# Patient Record
Sex: Male | Born: 1990 | Race: Black or African American | Hispanic: No | Marital: Single | Smoking: Current every day smoker
Health system: Southern US, Community
[De-identification: ages and names within clinical notes are randomized; demographics above are authoritative.]

---
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
## 2009-01-26 ENCOUNTER — Emergency Department (HOSPITAL_COMMUNITY): Admission: EM | Admit: 2009-01-26 | Discharge: 2009-01-26 | Payer: Self-pay | Admitting: Emergency Medicine

## 2009-09-25 ENCOUNTER — Emergency Department (HOSPITAL_COMMUNITY): Admission: EM | Admit: 2009-09-25 | Discharge: 2009-09-25 | Payer: Self-pay | Admitting: Emergency Medicine

## 2009-09-28 ENCOUNTER — Emergency Department (HOSPITAL_COMMUNITY): Admission: EM | Admit: 2009-09-28 | Discharge: 2009-09-28 | Payer: Self-pay | Admitting: Emergency Medicine

## 2010-05-07 LAB — CULTURE, ROUTINE-ABSCESS: Culture: NO GROWTH

## 2010-08-08 ENCOUNTER — Emergency Department (HOSPITAL_COMMUNITY)
Admission: EM | Admit: 2010-08-08 | Discharge: 2010-08-08 | Disposition: A | Discharge status: Home / Self Care | Payer: Self-pay | Attending: Emergency Medicine | Admitting: Emergency Medicine

## 2010-08-08 DIAGNOSIS — Y92410 Unspecified street and highway as the place of occurrence of the external cause: Secondary | ICD-10-CM | POA: Insufficient documentation

## 2010-08-08 DIAGNOSIS — F1910 Other psychoactive substance abuse, uncomplicated: Secondary | ICD-10-CM | POA: Insufficient documentation

## 2010-08-08 DIAGNOSIS — IMO0002 Reserved for concepts with insufficient information to code with codable children: Secondary | ICD-10-CM | POA: Insufficient documentation

## 2010-08-08 DIAGNOSIS — S0180XA Unspecified open wound of other part of head, initial encounter: Secondary | ICD-10-CM | POA: Insufficient documentation

## 2010-08-15 ENCOUNTER — Emergency Department (HOSPITAL_COMMUNITY)
Admission: EM | Admit: 2010-08-15 | Discharge: 2010-08-15 | Disposition: A | Discharge status: Home / Self Care | Payer: Self-pay | Attending: Emergency Medicine | Admitting: Emergency Medicine

## 2010-08-15 DIAGNOSIS — F1210 Cannabis abuse, uncomplicated: Secondary | ICD-10-CM | POA: Insufficient documentation

## 2010-08-15 DIAGNOSIS — Z4802 Encounter for removal of sutures: Secondary | ICD-10-CM | POA: Insufficient documentation

## 2010-09-08 ENCOUNTER — Emergency Department (HOSPITAL_COMMUNITY)
Admission: EM | Admit: 2010-09-08 | Discharge: 2010-09-08 | Disposition: A | Discharge status: Home / Self Care | Payer: Self-pay | Attending: Emergency Medicine | Admitting: Emergency Medicine

## 2010-09-08 ENCOUNTER — Encounter (HOSPITAL_COMMUNITY): Payer: Self-pay | Admitting: *Deleted

## 2010-09-08 DIAGNOSIS — M25569 Pain in unspecified knee: Secondary | ICD-10-CM | POA: Insufficient documentation

## 2010-09-08 DIAGNOSIS — L02419 Cutaneous abscess of limb, unspecified: Secondary | ICD-10-CM | POA: Insufficient documentation

## 2010-09-08 DIAGNOSIS — L0291 Cutaneous abscess, unspecified: Secondary | ICD-10-CM

## 2010-09-08 DIAGNOSIS — L0390 Cellulitis, unspecified: Secondary | ICD-10-CM

## 2010-09-08 DIAGNOSIS — M7989 Other specified soft tissue disorders: Secondary | ICD-10-CM | POA: Insufficient documentation

## 2010-09-08 MED ORDER — DOXYCYCLINE HYCLATE 100 MG PO CAPS: 100.0000 mg | ORAL_CAPSULE | Freq: Two times a day (BID) | ORAL | Status: AC

## 2010-09-08 MED ORDER — LIDOCAINE HCL 2 % IJ SOLN
INTRAMUSCULAR | Status: AC
  Filled 2010-09-08: qty 1

## 2010-09-08 MED ORDER — LIDOCAINE HCL 2 % IJ SOLN
10.0000 mL | Freq: Once | INTRAMUSCULAR | Status: AC
  Administered 2010-09-08: 13:00:00

## 2010-09-08 MED ORDER — LIDOCAINE HCL 2 % IJ SOLN: 10.0000 mL | Freq: Once | INTRAMUSCULAR | Status: DC

## 2010-09-08 NOTE — ED Notes (Signed)
Pt ambulatory to restroom, family at bedside,

## 2010-09-08 NOTE — ED Notes (Signed)
Was playing softball Sunday and first noted at ?spider bite to left knee area, more swollen and painful tonight

## 2010-09-08 NOTE — ED Notes (Signed)
MD at bedside. 

## 2010-09-08 NOTE — ED Notes (Signed)
Incision and drainage done by Flomo, with Carmina Miller PA

## 2010-09-08 NOTE — ED Notes (Signed)
?   Insect bite (spider) to left knee on Sunday. C/o pain and swelling since Monday and is getting worse. Redness noted around area. Also c/o sore throat.

## 2010-09-09 NOTE — ED Provider Notes (Addendum)
History     Chief Complaint  Patient presents with  . Insect Bite   Patient is a 20 y.o. male presenting with abscess. The history is provided by the patient (He thought he had a spider bite to right knee,  which has become larger and more tender today.).  Abscess  This is a new problem. The current episode started less than one week ago. The onset was gradual. The problem occurs continuously. The problem has been gradually worsening. Affected Location: right knee. The problem is moderate. The abscess is characterized by redness and painfulness. Pertinent negatives include no fever, no congestion, no sore throat and no cough. His past medical history does not include skin abscesses in family. There were no sick contacts. He has received no recent medical care.    History reviewed. No pertinent past medical history.  History reviewed. No pertinent past surgical history.  History reviewed. No pertinent family history.  History  Substance Use Topics  . Smoking status: Never Smoker   . Smokeless tobacco: Not on file  . Alcohol Use: Yes     occ      Review of Systems  Constitutional: Negative for fever.  HENT: Negative for congestion, sore throat and neck pain.   Eyes: Negative.   Respiratory: Negative for cough, chest tightness and shortness of breath.   Cardiovascular: Negative for chest pain.  Gastrointestinal: Negative for nausea and abdominal pain.  Genitourinary: Negative.   Musculoskeletal: Negative for joint swelling and arthralgias.  Skin: Negative for rash and wound.       Otherwise negative   Neurological: Negative for dizziness, weakness, light-headedness, numbness and headaches.  Hematological: Negative.   Psychiatric/Behavioral: Negative.     Physical Exam  BP 117/68  Pulse 71  Temp(Src) 98.6 F (37 C) (Oral)  Resp 16  Ht 5\' 11"  (1.803 m)  Wt 170 lb (77.111 kg)  BMI 23.71 kg/m2  SpO2 98%  Physical Exam  Vitals reviewed. Constitutional: He is oriented  to person, place, and time. He appears well-developed and well-nourished.  HENT:  Head: Normocephalic and atraumatic.  Mouth/Throat: Oropharynx is clear and moist.  Eyes: Conjunctivae are normal.  Neck: Normal range of motion.  Cardiovascular: Normal rate, regular rhythm, normal heart sounds and intact distal pulses.   Pulmonary/Chest: Effort normal and breath sounds normal. He has no wheezes.  Abdominal: Soft. Bowel sounds are normal. There is no tenderness.  Musculoskeletal: Normal range of motion.  Lymphadenopathy:    He has no cervical adenopathy.  Neurological: He is alert and oriented to person, place, and time.  Skin: Skin is warm and dry.     Psychiatric: He has a normal mood and affect.    ED Course  INCISION AND DRAINAGE Performed by: IDOL, JULIE L Authorized by: Burgess Amor L Consent: Verbal consent obtained. Risks and benefits: risks, benefits and alternatives were discussed (I and D performed with I.  Flomo,  medical student at bedside.) Consent given by: patient Type: abscess Body area: lower extremity Anesthesia: local infiltration Local anesthetic: lidocaine 2% without epinephrine Patient sedated: no Incision type: single straight Complexity: simple Drainage: purulent Drainage amount: scant Wound treatment: wound left open Packing material: 1/2 in gauze Patient tolerance: Patient tolerated the procedure well with no immediate complications.    MDM       Candis Musa, PA  Medical screening examination/treatment/procedure(s) were performed by non-physician practitioner and as supervising physician I was immediately available for consultation/collaboration. 09/09/10 2310  Benny Lennert, MD 10/08/10  1336  Benny Lennert, MD 10/08/10 [phone]

## 2010-09-27 ENCOUNTER — Emergency Department (HOSPITAL_COMMUNITY): Payer: No Typology Code available for payment source

## 2010-09-27 ENCOUNTER — Encounter (HOSPITAL_COMMUNITY): Payer: Self-pay | Admitting: Emergency Medicine

## 2010-09-27 ENCOUNTER — Emergency Department (HOSPITAL_COMMUNITY)
Admission: EM | Admit: 2010-09-27 | Discharge: 2010-09-27 | Discharge status: Against Medical Advice | Payer: No Typology Code available for payment source | Attending: Emergency Medicine | Admitting: Emergency Medicine

## 2010-09-27 DIAGNOSIS — R079 Chest pain, unspecified: Secondary | ICD-10-CM | POA: Insufficient documentation

## 2010-09-27 DIAGNOSIS — M542 Cervicalgia: Secondary | ICD-10-CM | POA: Insufficient documentation
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
## 2010-09-27 NOTE — ED Notes (Signed)
edp at bedside.removed from lsb.  Pt removed c-collar by self ama.
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
## 2011-09-25 ENCOUNTER — Emergency Department (HOSPITAL_COMMUNITY)
Admission: EM | Admit: 2011-09-25 | Discharge: 2011-09-25 | Disposition: A | Discharge status: Home / Self Care | Payer: Self-pay | Attending: Emergency Medicine | Admitting: Emergency Medicine

## 2011-09-25 ENCOUNTER — Encounter (HOSPITAL_COMMUNITY): Payer: Self-pay | Admitting: Emergency Medicine

## 2011-09-25 DIAGNOSIS — J039 Acute tonsillitis, unspecified: Secondary | ICD-10-CM | POA: Insufficient documentation

## 2011-09-25 MED ORDER — AMOXICILLIN 500 MG PO CAPS: 500.0000 mg | ORAL_CAPSULE | Freq: Three times a day (TID) | ORAL | Status: AC

## 2011-09-25 MED ORDER — IBUPROFEN 800 MG PO TABS: 800.0000 mg | ORAL_TABLET | Freq: Three times a day (TID) | ORAL | Status: AC

## 2011-09-25 NOTE — ED Provider Notes (Signed)
Medical screening examination/treatment/procedure(s) were performed by non-physician practitioner and as supervising physician I was immediately available for consultation/collaboration.   Chidubem Chaires W Arrion Broaddus, MD 09/25/11 1653 

## 2011-09-25 NOTE — ED Provider Notes (Signed)
History     CSN: [id number]  Arrival date & time 09/25/11  1228   First MD Initiated Contact with Patient 09/25/11 1335      Chief Complaint  Patient presents with  . Sore Throat    (Consider location/radiation/quality/duration/timing/severity/associated sxs/prior treatment) HPI Comments: Kevin Byrd presents with a 3 day history of sore throat along with right sided neck swelling and right ear pain which is worsened with swallowing.  He has had some subjective fevers which have improved with ibuprofen, his last dose was taken last night, taking 800 mg strength tablet which he borrowed  from his mother.  He denies nasal congestion, chest pain or shortness of breath, but has had an occasional nonproductive cough.  He does state he coughed up a clear thick piece of sputum with a streak of pink  tinge which might have been blood this morning.  The history is provided by the patient.    History reviewed. No pertinent past medical history.  History reviewed. No pertinent past surgical history.  History reviewed. No pertinent family history.  History  Substance Use Topics  . Smoking status: Never Smoker   . Smokeless tobacco: Not on file  . Alcohol Use: Yes     occ      Review of Systems  Constitutional: Positive for fever.  HENT: Positive for sore throat and neck pain. Negative for congestion.   Eyes: Negative.   Respiratory: Positive for cough. Negative for choking, chest tightness, shortness of breath and wheezing.   Cardiovascular: Negative for chest pain.  Gastrointestinal: Negative for nausea and abdominal pain.  Genitourinary: Negative.   Musculoskeletal: Negative for joint swelling and arthralgias.  Skin: Negative.  Negative for rash and wound.  Neurological: Negative for dizziness, weakness, light-headedness, numbness and headaches.  Hematological: Negative.   Psychiatric/Behavioral: Negative.     Allergies  Review of patient's allergies indicates no known  allergies.  Home Medications   Current Outpatient Rx  Name Route Sig Dispense Refill  . IBUPROFEN 800 MG PO TABS Oral Take 800 mg by mouth every 6 (six) hours as needed. For pain    . AMOXICILLIN 500 MG PO CAPS Oral Take 1 capsule (500 mg total) by mouth 3 (three) times daily. 30 capsule 0  . IBUPROFEN 800 MG PO TABS Oral Take 1 tablet (800 mg total) by mouth 3 (three) times daily. 21 tablet 0    BP 118/79  Pulse 61  Temp 98.7 F (37.1 C)  Resp 17  SpO2 100%  Physical Exam  Nursing note and vitals reviewed. Constitutional: He appears well-developed and well-nourished.  HENT:  Head: Normocephalic and atraumatic.  Right Ear: Tympanic membrane, external ear and ear canal normal.  Left Ear: Tympanic membrane, external ear and ear canal normal.  Nose: No mucosal edema.  Mouth/Throat: Uvula is midline. Posterior oropharyngeal erythema present.       Bilateral tonsillar hypertrophy, erythema with several exudative spots.  Uvula is midline, no tonsillar abscesses appreciated.  Scant trace of blood in left nostril without source of active bleeding.  Eyes: Conjunctivae are normal.  Neck: Normal range of motion.  Cardiovascular: Normal rate, regular rhythm, normal heart sounds and intact distal pulses.   Pulmonary/Chest: Effort normal and breath sounds normal. He has no wheezes.  Abdominal: Soft. Bowel sounds are normal. There is no tenderness.  Musculoskeletal: Normal range of motion.  Neurological: He is alert.  Skin: Skin is warm and dry.  Psychiatric: He has a normal mood and affect.  ED Course  Procedures (including critical care time)   Labs Reviewed  RAPID STREP SCREEN   No results found.   1. Tonsillitis       MDM  Rapid strep test was negative.  Patient treated for tonsillitis with Amoxil.  Also encouraged warm salt gargles, prescribed ibuprofen 800 mg 3 times a day.  Suspect pink tinged sputum may have been a nasal/sinus origin.      Burgess Amor,  PA 09/25/11 1435

## 2011-09-25 NOTE — ED Notes (Signed)
Pt c/o sore throat x 3 days. C/o r side of neck swelling. Coughed up some blood this am. Trouble swallowing. Nad.

## 2012-08-16 ENCOUNTER — Emergency Department (HOSPITAL_COMMUNITY)
Admission: EM | Admit: 2012-08-16 | Discharge: 2012-08-16 | Disposition: A | Discharge status: Home / Self Care | Payer: Self-pay | Attending: Emergency Medicine | Admitting: Emergency Medicine

## 2012-08-16 ENCOUNTER — Encounter (HOSPITAL_COMMUNITY): Payer: Self-pay | Admitting: *Deleted

## 2012-08-16 DIAGNOSIS — Z113 Encounter for screening for infections with a predominantly sexual mode of transmission: Secondary | ICD-10-CM | POA: Insufficient documentation

## 2012-08-16 MED ORDER — AZITHROMYCIN 250 MG PO TABS: 1000.0000 mg | ORAL_TABLET | Freq: Once | ORAL | Status: DC

## 2012-08-16 MED ORDER — CEFTRIAXONE SODIUM 250 MG IJ SOLR: 250.0000 mg | Freq: Once | INTRAMUSCULAR | Status: DC

## 2012-08-16 NOTE — ED Provider Notes (Signed)
   History    CSN: [id number] Arrival date & time 08/16/12  2214  First MD Initiated Contact with Patient 08/16/12 2229     Chief Complaint  Patient presents with  . SEXUALLY TRANSMITTED DISEASE    Pt wants to get checked for STD's    (Consider location/radiation/quality/duration/timing/severity/associated sxs/prior Treatment) The history is provided by the patient.  NAHOME BUBLITZ is a 22 y.o. male who presents to the ED for STD testing. He states that he has no symptoms but wants to be tested. He request a paper saying he is negative for STD's.   History reviewed. No pertinent past medical history. History reviewed. No pertinent past surgical history. History reviewed. No pertinent family history. History  Substance Use Topics  . Smoking status: Never Smoker   . Smokeless tobacco: Not on file  . Alcohol Use: Yes     Comment: occ    Review of Systems  Constitutional: Negative for fever.  HENT: Negative for sore throat and neck pain.   Gastrointestinal: Negative for vomiting and abdominal pain.  Genitourinary: Negative for dysuria, urgency, frequency, penile swelling, scrotal swelling, penile pain and testicular pain.  Musculoskeletal: Negative for back pain.  Skin: Negative for rash.  Psychiatric/Behavioral: The patient is not nervous/anxious.     Allergies  Bee venom  Home Medications   Current Outpatient Rx  Name  Route  Sig  Dispense  Refill  . ibuprofen (ADVIL,MOTRIN) 800 MG tablet   Oral   Take 800 mg by mouth every 6 (six) hours as needed. For pain          BP 128/78  Pulse 85  Temp(Src) 97.2 F (36.2 C) (Oral)  Resp 16  SpO2 98% Physical Exam  Nursing note and vitals reviewed. Constitutional: He is oriented to person, place, and time. He appears well-developed and well-nourished. No distress.  HENT:  Head: Normocephalic and atraumatic.  Eyes: EOM are normal.  Neck: Neck supple.  Cardiovascular: Normal rate.   Pulmonary/Chest: Effort normal.   Abdominal: Soft. There is no tenderness.  Genitourinary: Testes normal. Circumcised. No penile erythema or penile tenderness. No discharge found.  Musculoskeletal: Normal range of motion.  Lymphadenopathy:       Right: No inguinal adenopathy present.       Left: No inguinal adenopathy present.  Neurological: He is alert and oriented to person, place, and time. No cranial nerve deficit.  Skin: Skin is warm and dry.  Psychiatric: He has a normal mood and affect. His behavior is normal.    ED Course  Procedures  MDM  22 y.o. male here for STD screening. Screening for GC and Chlamydia. Patient to go to health department for any further screening.  Discussed with the patient clinical findings and all questioned fully answered.  [place], [place] 08/16/12 2311

## 2012-08-16 NOTE — ED Provider Notes (Signed)
Medical screening examination/treatment/procedure(s) were performed by non-physician practitioner and as supervising physician I was immediately available for consultation/collaboration. Menucha Dicesare, MD, FACEP   Rayen Palen L Miro Balderson, MD 08/16/12 2328 

## 2012-08-16 NOTE — ED Notes (Signed)
Pt states he may or may not have been exposed to STD, wants to get checked out.

## 2012-08-18 LAB — GC/CHLAMYDIA PROBE AMP: CT Probe RNA: NEGATIVE

## 2013-02-15 ENCOUNTER — Encounter (HOSPITAL_COMMUNITY): Payer: Self-pay | Admitting: Emergency Medicine

## 2013-02-15 ENCOUNTER — Emergency Department (HOSPITAL_COMMUNITY): Payer: Self-pay

## 2013-02-15 ENCOUNTER — Emergency Department (HOSPITAL_COMMUNITY)
Admission: EM | Admit: 2013-02-15 | Discharge: 2013-02-15 | Disposition: A | Discharge status: Home / Self Care | Payer: Self-pay | Attending: Emergency Medicine | Admitting: Emergency Medicine

## 2013-02-15 DIAGNOSIS — S022XXA Fracture of nasal bones, initial encounter for closed fracture: Secondary | ICD-10-CM | POA: Insufficient documentation

## 2013-02-15 DIAGNOSIS — S0003XA Contusion of scalp, initial encounter: Secondary | ICD-10-CM | POA: Insufficient documentation

## 2013-02-15 DIAGNOSIS — K0889 Other specified disorders of teeth and supporting structures: Secondary | ICD-10-CM

## 2013-02-15 DIAGNOSIS — S025XXA Fracture of tooth (traumatic), initial encounter for closed fracture: Secondary | ICD-10-CM | POA: Insufficient documentation

## 2013-02-15 DIAGNOSIS — S0083XA Contusion of other part of head, initial encounter: Secondary | ICD-10-CM

## 2013-02-15 DIAGNOSIS — Y9301 Activity, walking, marching and hiking: Secondary | ICD-10-CM | POA: Insufficient documentation

## 2013-02-15 DIAGNOSIS — Z791 Long term (current) use of non-steroidal anti-inflammatories (NSAID): Secondary | ICD-10-CM | POA: Insufficient documentation

## 2013-02-15 DIAGNOSIS — W010XXA Fall on same level from slipping, tripping and stumbling without subsequent striking against object, initial encounter: Secondary | ICD-10-CM | POA: Insufficient documentation

## 2013-02-15 DIAGNOSIS — Y929 Unspecified place or not applicable: Secondary | ICD-10-CM | POA: Insufficient documentation

## 2013-02-15 DIAGNOSIS — W108XXA Fall (on) (from) other stairs and steps, initial encounter: Secondary | ICD-10-CM | POA: Insufficient documentation

## 2013-02-15 MED ORDER — NAPROXEN 250 MG PO TABS: 500.0000 mg | ORAL_TABLET | Freq: Once | ORAL | Status: DC

## 2013-02-15 MED ORDER — NAPROXEN 500 MG PO TABS: 500.0000 mg | ORAL_TABLET | Freq: Two times a day (BID) | ORAL | Status: DC

## 2013-02-15 NOTE — ED Notes (Signed)
Pt work given to pt mother. Family escorted pt out to car before getting medication here.

## 2013-02-15 NOTE — ED Notes (Signed)
Waking pt up & he moans & turns his head. Family in room to get pt up.

## 2013-02-15 NOTE — ED Provider Notes (Signed)
CSN: [id number]     Arrival date & time 02/15/13  0038 History   First MD Initiated Contact with Patient 02/15/13 0205     Chief Complaint  Patient presents with  . Fall  . Eye Injury   (Consider location/radiation/quality/duration/timing/severity/associated sxs/prior Treatment) HPI Comments: T1-year-old male who states that he slipped while walking down stairs falling onto the concrete with his face. He complains of acute onset of constant left-sided periorbital pain, lower dental pain and swollen lips. This is worse with palpation, associated with periorbital bruising and swelling and the inability to open the left eye. He denies any chest pain, arm or leg pain and has been ambulatory since the event with no nausea vomiting or changes in vision.  Patient is a 22 y.o. male presenting with fall and eye injury. The history is provided by the patient and a relative.  Fall  Eye Injury    History reviewed. No pertinent past medical history. History reviewed. No pertinent past surgical history. History reviewed. No pertinent family history. History  Substance Use Topics  . Smoking status: Never Smoker   . Smokeless tobacco: Not on file  . Alcohol Use: Yes     Comment: occ    Review of Systems  All other systems reviewed and are negative.    Allergies  Bee venom  Home Medications   Current Outpatient Rx  Name  Route  Sig  Dispense  Refill  . naproxen (NAPROSYN) 500 MG tablet   Oral   Take 1 tablet (500 mg total) by mouth 2 (two) times daily with a meal.   30 tablet   0    BP 113/70  Pulse 67  Temp(Src) 98.4 F (36.9 C) (Oral)  Resp 16  Ht 5\' 11"  (1.803 m)  Wt 160 lb (72.576 kg)  BMI 22.33 kg/m2  SpO2 100% Physical Exam  Nursing note and vitals reviewed. Constitutional: He appears well-developed and well-nourished. No distress.  HENT:  Head: Normocephalic.  Mouth/Throat: Oropharynx is clear and moist. No oropharyngeal exudate.  Lower central incisors with  bilateral minimal movement and tenderness, no intrusion, extrusion, minimal subluxation of bruising to the bilateral upper and lower lips without active bleeding, small 2mm laceration not bleeding.  No malocclusin, no hemotympanum.  No battle's sign.  Eyes: Conjunctivae and EOM are normal. Pupils are equal, round, and reactive to light. Right eye exhibits no discharge. Left eye exhibits no discharge. No scleral icterus.  Periorbital swelling and bruising and ecchymosis, inability to open the left eyelid because of significant swelling of the upper lid. 1 mm laceration to the upper lid that is not bleeding, no foreign body seen.  Neck: Normal range of motion. Neck supple. No JVD present. No thyromegaly present.  Cardiovascular: Normal rate, regular rhythm, normal heart sounds and intact distal pulses.  Exam reveals no gallop and no friction rub.   No murmur heard. Pulmonary/Chest: Effort normal and breath sounds normal. No respiratory distress. He has no wheezes. He has no rales.  Abdominal: Soft. Bowel sounds are normal. He exhibits no distension and no mass. There is no tenderness.  Musculoskeletal: Normal range of motion. He exhibits no edema and no tenderness.  Lymphadenopathy:    He has no cervical adenopathy.  Neurological: He is alert. Coordination normal.  Moves all extremities x4 without difficulty, normal pronation, normal speech, normal strength  Skin: Skin is warm and dry. No rash noted. No erythema.  Psychiatric: He has a normal mood and affect. His behavior is normal.  ED Course  Procedures (including critical care time) Labs Review Labs Reviewed - No data to display Imaging Review Ct Head Wo Contrast  02/15/2013   CLINICAL DATA:  Status post fall down stairs; extensive swelling, bruising and lacerations about the left orbit, face and lips. Concern for head or cervical spine injury.  EXAM: CT HEAD WITHOUT CONTRAST  CT MAXILLOFACIAL WITHOUT CONTRAST  CT CERVICAL SPINE WITHOUT  CONTRAST  TECHNIQUE: Multidetector CT imaging of the head, cervical spine, and maxillofacial structures were performed using the standard protocol without intravenous contrast. Multiplanar CT image reconstructions of the cervical spine and maxillofacial structures were also generated.  COMPARISON:  Cervical spine radiographs performed 09/27/2010  FINDINGS: CT HEAD FINDINGS  There is no evidence of acute infarction, mass lesion, or intra- or extra-axial hemorrhage on CT.  The posterior fossa, including the cerebellum, brainstem and fourth ventricle, is within normal limits. The third and lateral ventricles, and basal ganglia are unremarkable in appearance. The cerebral hemispheres are symmetric in appearance, with normal gray-white differentiation. No mass effect or midline shift is seen.  There is no evidence of fracture; visualized osseous structures are unremarkable in appearance. The visualized portions of the orbits are within normal limits. The paranasal sinuses and mastoid air cells are well-aerated. Marked soft tissue swelling is noted about the left orbit.  CT MAXILLOFACIAL FINDINGS  There is a minimally displaced fracture involving the left side of the nasal bone, with associated soft tissue swelling. The maxilla and mandible appear intact. The visualized dentition demonstrates no acute abnormality.  The orbits are intact bilaterally. The visualized paranasal sinuses and mastoid air cells are well-aerated.  Marked soft tissue swelling is noted about the left orbit. Mild soft tissue swelling is noted overlying the left maxilla and at the lips. The parapharyngeal fat planes are preserved. The nasopharynx, oropharynx and hypopharynx are unremarkable in appearance. The visualized portions of the valleculae and piriform sinuses are grossly unremarkable.  The parotid and submandibular glands are within normal limits. No cervical lymphadenopathy is seen.  CT CERVICAL SPINE FINDINGS  There is no evidence of  fracture or subluxation. Vertebral bodies demonstrate normal height and alignment. Intervertebral disc spaces are preserved. Prevertebral soft tissues are within normal limits. The visualized neural foramina are grossly unremarkable.  The thyroid gland is unremarkable in appearance. The visualized lung apices are clear. No significant soft tissue abnormalities are seen.  IMPRESSION: 1. No evidence of traumatic intracranial injury. 2. Minimally displaced fracture involving the left side of the nasal bone, with associated soft tissue swelling. 3. No evidence of fracture or subluxation along the cervical spine. 4. Marked soft tissue swelling about the left orbit. Mild soft tissue swelling overlying the left maxilla and at the lips.   Electronically Signed   By: Roanna Raider M.D.   On: 02/15/2013 03:00   Ct Cervical Spine Wo Contrast  02/15/2013   CLINICAL DATA:  Status post fall down stairs; extensive swelling, bruising and lacerations about the left orbit, face and lips. Concern for head or cervical spine injury.  EXAM: CT HEAD WITHOUT CONTRAST  CT MAXILLOFACIAL WITHOUT CONTRAST  CT CERVICAL SPINE WITHOUT CONTRAST  TECHNIQUE: Multidetector CT imaging of the head, cervical spine, and maxillofacial structures were performed using the standard protocol without intravenous contrast. Multiplanar CT image reconstructions of the cervical spine and maxillofacial structures were also generated.  COMPARISON:  Cervical spine radiographs performed 09/27/2010  FINDINGS: CT HEAD FINDINGS  There is no evidence of acute infarction, mass lesion, or intra- or  extra-axial hemorrhage on CT.  The posterior fossa, including the cerebellum, brainstem and fourth ventricle, is within normal limits. The third and lateral ventricles, and basal ganglia are unremarkable in appearance. The cerebral hemispheres are symmetric in appearance, with normal gray-white differentiation. No mass effect or midline shift is seen.  There is no evidence  of fracture; visualized osseous structures are unremarkable in appearance. The visualized portions of the orbits are within normal limits. The paranasal sinuses and mastoid air cells are well-aerated. Marked soft tissue swelling is noted about the left orbit.  CT MAXILLOFACIAL FINDINGS  There is a minimally displaced fracture involving the left side of the nasal bone, with associated soft tissue swelling. The maxilla and mandible appear intact. The visualized dentition demonstrates no acute abnormality.  The orbits are intact bilaterally. The visualized paranasal sinuses and mastoid air cells are well-aerated.  Marked soft tissue swelling is noted about the left orbit. Mild soft tissue swelling is noted overlying the left maxilla and at the lips. The parapharyngeal fat planes are preserved. The nasopharynx, oropharynx and hypopharynx are unremarkable in appearance. The visualized portions of the valleculae and piriform sinuses are grossly unremarkable.  The parotid and submandibular glands are within normal limits. No cervical lymphadenopathy is seen.  CT CERVICAL SPINE FINDINGS  There is no evidence of fracture or subluxation. Vertebral bodies demonstrate normal height and alignment. Intervertebral disc spaces are preserved. Prevertebral soft tissues are within normal limits. The visualized neural foramina are grossly unremarkable.  The thyroid gland is unremarkable in appearance. The visualized lung apices are clear. No significant soft tissue abnormalities are seen.  IMPRESSION: 1. No evidence of traumatic intracranial injury. 2. Minimally displaced fracture involving the left side of the nasal bone, with associated soft tissue swelling. 3. No evidence of fracture or subluxation along the cervical spine. 4. Marked soft tissue swelling about the left orbit. Mild soft tissue swelling overlying the left maxilla and at the lips.   Electronically Signed   By: Roanna Raider M.D.   On: 02/15/2013 03:00   Ct  Maxillofacial Wo Cm  02/15/2013   CLINICAL DATA:  Status post fall down stairs; extensive swelling, bruising and lacerations about the left orbit, face and lips. Concern for head or cervical spine injury.  EXAM: CT HEAD WITHOUT CONTRAST  CT MAXILLOFACIAL WITHOUT CONTRAST  CT CERVICAL SPINE WITHOUT CONTRAST  TECHNIQUE: Multidetector CT imaging of the head, cervical spine, and maxillofacial structures were performed using the standard protocol without intravenous contrast. Multiplanar CT image reconstructions of the cervical spine and maxillofacial structures were also generated.  COMPARISON:  Cervical spine radiographs performed 09/27/2010  FINDINGS: CT HEAD FINDINGS  There is no evidence of acute infarction, mass lesion, or intra- or extra-axial hemorrhage on CT.  The posterior fossa, including the cerebellum, brainstem and fourth ventricle, is within normal limits. The third and lateral ventricles, and basal ganglia are unremarkable in appearance. The cerebral hemispheres are symmetric in appearance, with normal gray-white differentiation. No mass effect or midline shift is seen.  There is no evidence of fracture; visualized osseous structures are unremarkable in appearance. The visualized portions of the orbits are within normal limits. The paranasal sinuses and mastoid air cells are well-aerated. Marked soft tissue swelling is noted about the left orbit.  CT MAXILLOFACIAL FINDINGS  There is a minimally displaced fracture involving the left side of the nasal bone, with associated soft tissue swelling. The maxilla and mandible appear intact. The visualized dentition demonstrates no acute abnormality.  The orbits are  intact bilaterally. The visualized paranasal sinuses and mastoid air cells are well-aerated.  Marked soft tissue swelling is noted about the left orbit. Mild soft tissue swelling is noted overlying the left maxilla and at the lips. The parapharyngeal fat planes are preserved. The nasopharynx,  oropharynx and hypopharynx are unremarkable in appearance. The visualized portions of the valleculae and piriform sinuses are grossly unremarkable.  The parotid and submandibular glands are within normal limits. No cervical lymphadenopathy is seen.  CT CERVICAL SPINE FINDINGS  There is no evidence of fracture or subluxation. Vertebral bodies demonstrate normal height and alignment. Intervertebral disc spaces are preserved. Prevertebral soft tissues are within normal limits. The visualized neural foramina are grossly unremarkable.  The thyroid gland is unremarkable in appearance. The visualized lung apices are clear. No significant soft tissue abnormalities are seen.  IMPRESSION: 1. No evidence of traumatic intracranial injury. 2. Minimally displaced fracture involving the left side of the nasal bone, with associated soft tissue swelling. 3. No evidence of fracture or subluxation along the cervical spine. 4. Marked soft tissue swelling about the left orbit. Mild soft tissue swelling overlying the left maxilla and at the lips.   Electronically Signed   By: Roanna Raider M.D.   On: 02/15/2013 03:00    EKG Interpretation   None       MDM   1. Closed fracture nasal bone, initial encounter   2. Contusion of face, initial encounter   3. Subluxation of tooth    The patient has no tenderness over his spine, he is neurologically intact moving all 4 extremities but does have acute facial injuries with the dental trauma as well. He will need CT scan imaging as he is intoxicated to rule out spinal fracture, intracranial hemorrhage or facial fractures. I cannot evaluate his left globe at this time clinically as his lid is too swollen, ice pack applied, ophthalmology followup will be necessary.  CT scan reveals that the patient has a slight nasal bone fracture, there is no nasal septal hematoma on exam. It also shows that he has contusions about the face but no signs of facial, spinal or cranial fractures or  intracranial hemorrhage. Patient made aware of findings, ice packs for swelling, necessary ophthalmologic followup as the patient has significant swelling and I am unable to visualize the eyeball because of the swelling. CT scan reveals no significant findings of the orbit to raise concern for a retro-orbital hematoma or rupture of globe. The patient has expressed his understanding.  Meds given in ED:  Medications - No data to display  New Prescriptions   NAPROXEN (NAPROSYN) 500 MG TABLET    Take 1 tablet (500 mg total) by mouth 2 (two) times daily with a meal.      Vida Roller, MD 02/15/13 [phone]

## 2013-02-15 NOTE — ED Notes (Signed)
Pt states he slipped going down some steps, (8). Pt left eye swollen shut & left lips swollen also. Pt states LOC of 4 to 5 min.

## 2013-02-15 NOTE — ED Notes (Signed)
Pt escorted out by family members.

## 2013-02-15 NOTE — ED Notes (Signed)
Patient reports fell down steps and hit head and left eye. Complaining of pain to left eye and headache. Small abrasion/laceration noted above left eyebrow, severe swelling around left eye, and blood noted on lips.

## 2014-10-20 IMAGING — CT CT HEAD W/O CM
4 of 9 series · 15 of 47 positions shown, 17 images · non-contrast
Comparison: Cervical spine radiographs performed 09/27/2010

CLINICAL DATA: Status post fall down stairs; extensive swelling,
bruising and lacerations about the left orbit, face and lips.
Concern for head or cervical spine injury.

EXAM:
CT HEAD WITHOUT CONTRAST
CT MAXILLOFACIAL WITHOUT CONTRAST
CT CERVICAL SPINE WITHOUT CONTRAST
TECHNIQUE: Multidetector CT imaging of the head, cervical spine, and
maxillofacial structures were performed using the standard protocol
without intravenous contrast. Multiplanar CT image reconstructions
of the cervical spine and maxillofacial structures were also
generated.

[Series 3: headseq 2.4 h60s · axial · 0.47mm/px · z∈[+275,+362]mm · 3 of 72 slices shown]
[im 18/72  brain]
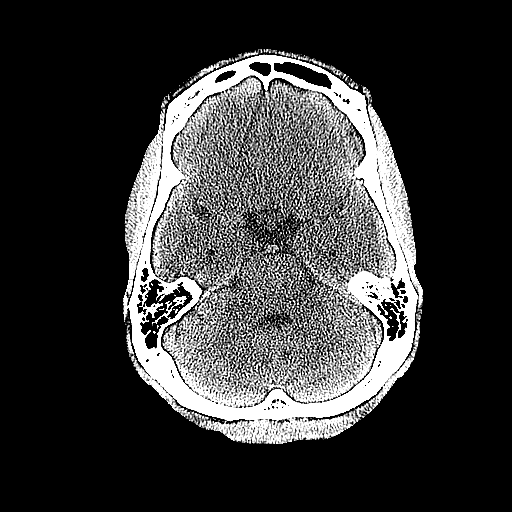
[im 36/72  brain]
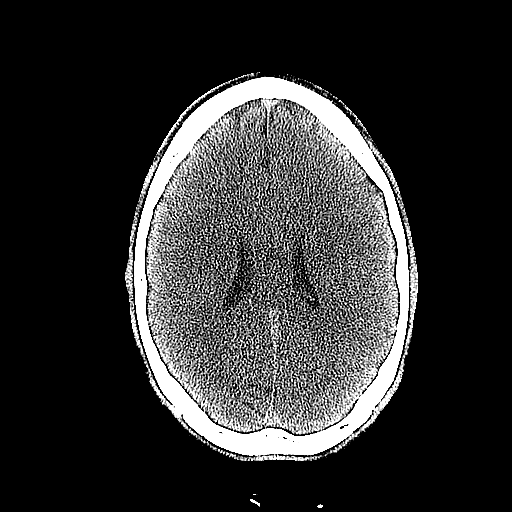
[im 54/72  brain]
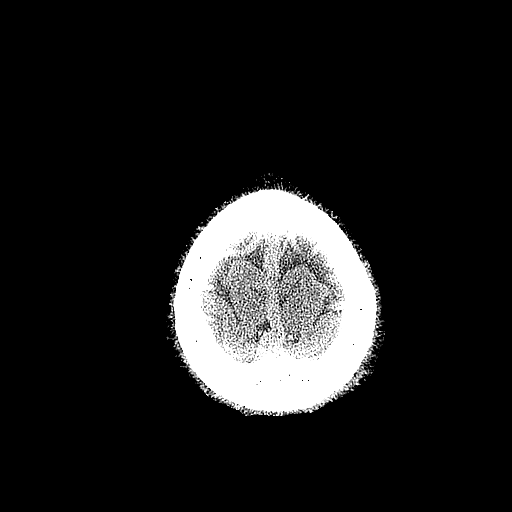

[Series 4: max soft 2.0 h31s · axial · 0.39mm/px · z∈[+136,+267]mm · 7 of 117 slices shown, 9 images]
[im 15/117  brain]
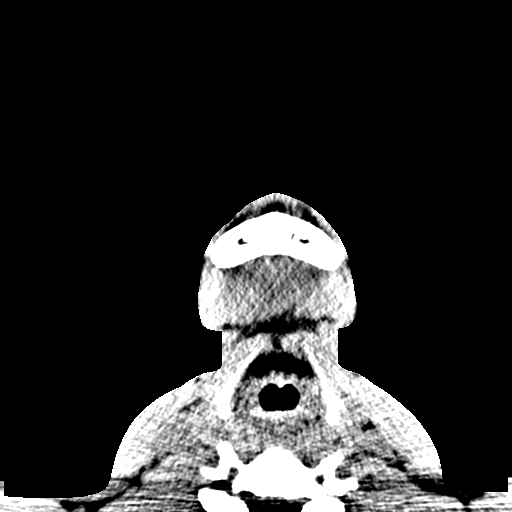
[im 15/117  bone]
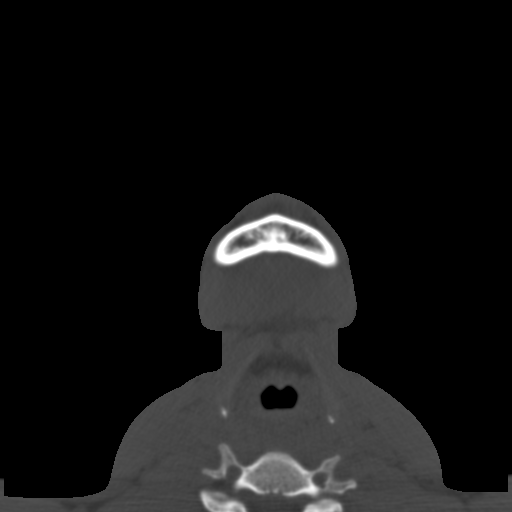
[im 30/117  brain]
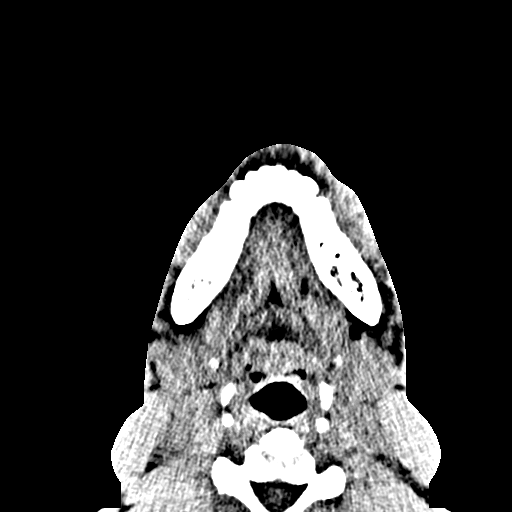
[im 44/117  brain]
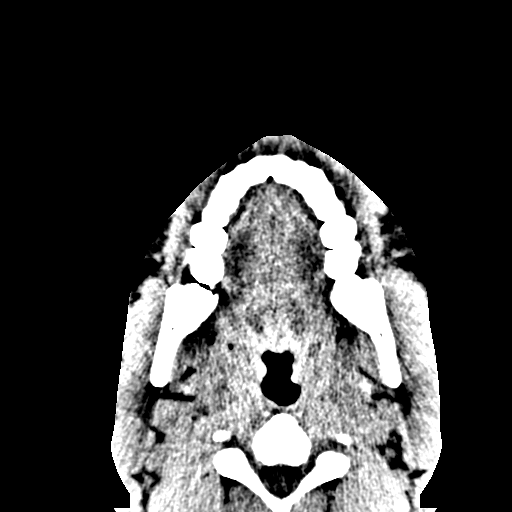
[im 59/117  brain]
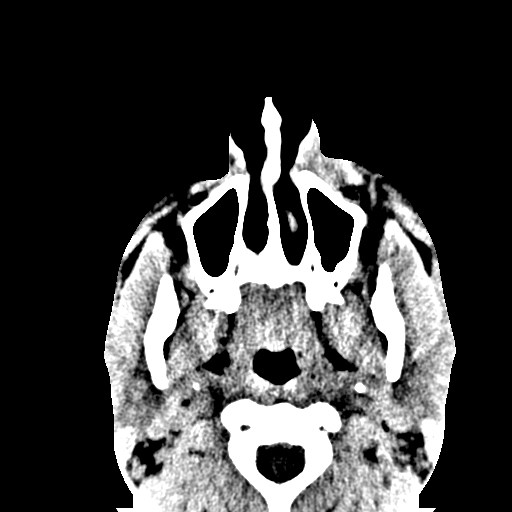
[im 73/117  brain]
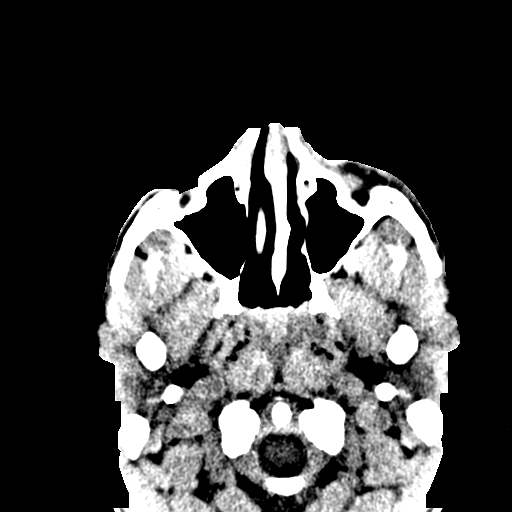
[im 73/117  bone]
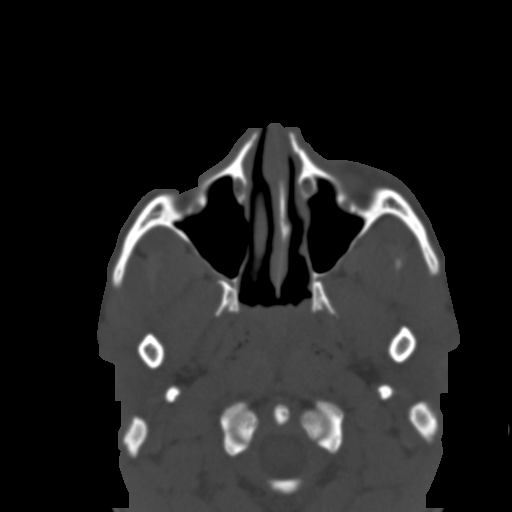
[im 88/117  brain]
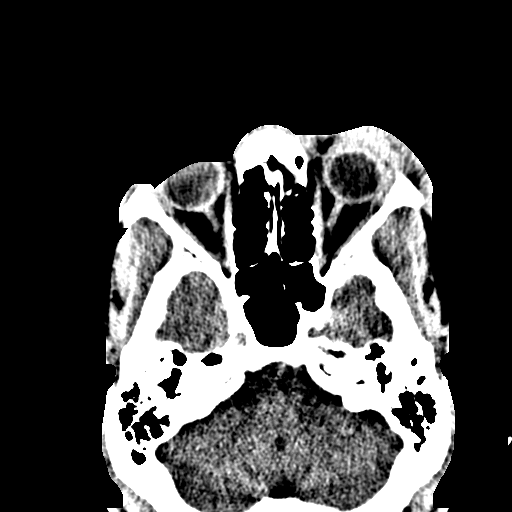
[im 102/117  brain]
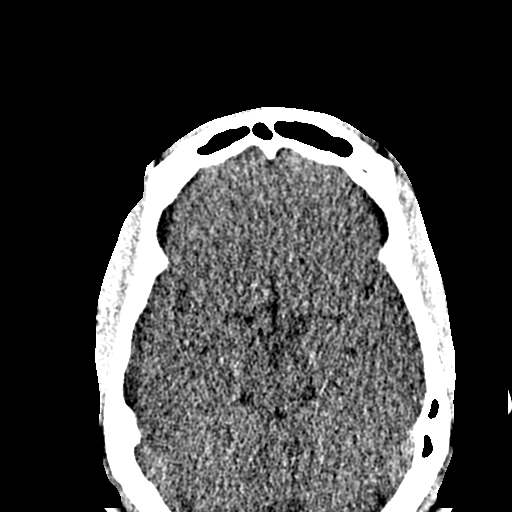

[Series 6: max st coronal · coronal · 0.36mm/px · 3 of 90 slices shown]
[im 18/90  brain]
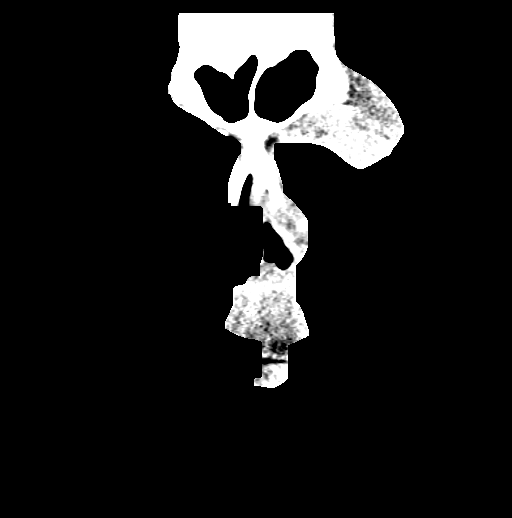
[im 36/90  brain]
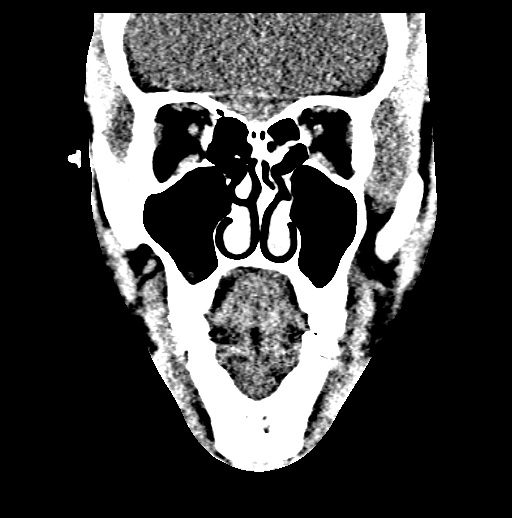
[im 54/90  brain]
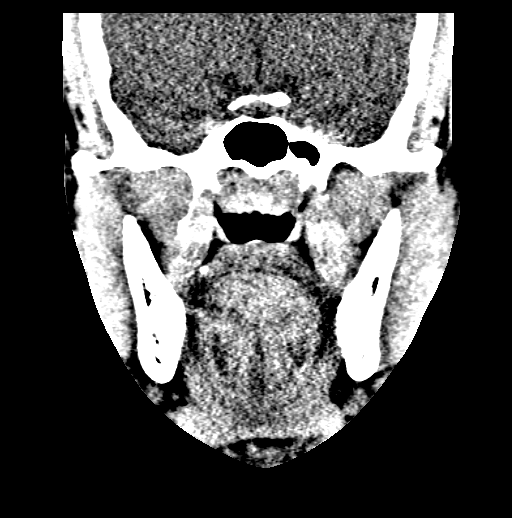

[Series 7: max st sagittal · sagittal · 0.35mm/px · 2 of 90 slices shown]
[im 30/90  brain]
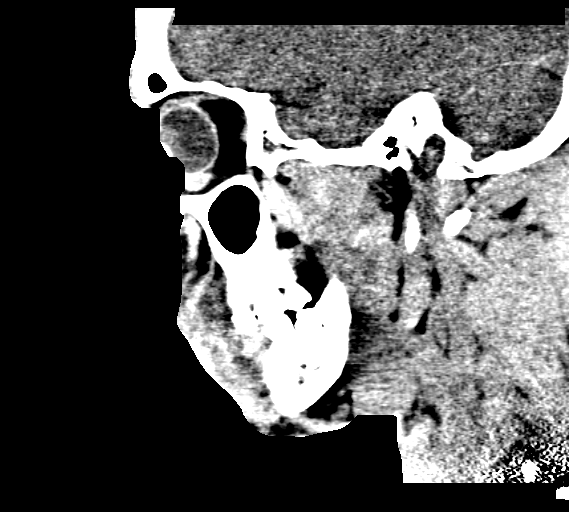
[im 60/90  brain]
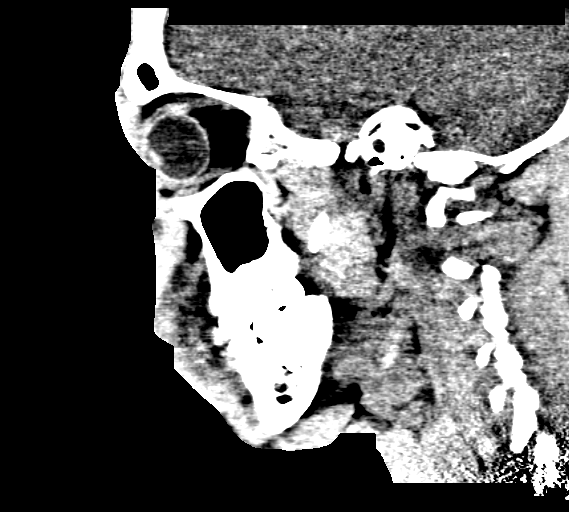

[15 of 47 positions shown; findings below may reference images not displayed]

FINDINGS: CT HEAD FINDINGS

There is no evidence of acute infarction, mass lesion, or intra- or
extra-axial hemorrhage on CT.

The posterior fossa, including the cerebellum, brainstem and fourth
ventricle, is within normal limits. The third and lateral
ventricles, and basal ganglia are unremarkable in appearance. The
cerebral hemispheres are symmetric in appearance, with normal
gray-white differentiation. No mass effect or midline shift is seen.

There is no evidence of fracture; visualized osseous structures are
unremarkable in appearance. The visualized portions of the orbits
are within normal limits. The paranasal sinuses and mastoid air
cells are well-aerated. Marked soft tissue swelling is noted about
the left orbit.

CT MAXILLOFACIAL FINDINGS

There is a minimally displaced fracture involving the left side of
the nasal bone, with associated soft tissue swelling. The maxilla
and mandible appear intact. The visualized dentition demonstrates no
acute abnormality.

The orbits are intact bilaterally. The visualized paranasal sinuses
and mastoid air cells are well-aerated.

Marked soft tissue swelling is noted about the left orbit. Mild soft
tissue swelling is noted overlying the left maxilla and at the lips.
The parapharyngeal fat planes are preserved. The nasopharynx,
oropharynx and hypopharynx are unremarkable in appearance. The
visualized portions of the valleculae and piriform sinuses are
grossly unremarkable.

The parotid and submandibular glands are within normal limits. No
cervical lymphadenopathy is seen.

CT CERVICAL SPINE FINDINGS

There is no evidence of fracture or subluxation. Vertebral bodies
demonstrate normal height and alignment. Intervertebral disc spaces
are preserved. Prevertebral soft tissues are within normal limits.
The visualized neural foramina are grossly unremarkable.

The thyroid gland is unremarkable in appearance. The visualized lung
apices are clear. No significant soft tissue abnormalities are seen.
IMPRESSION: 1. No evidence of traumatic intracranial injury.
2. Minimally displaced fracture involving the left side of the nasal
bone, with associated soft tissue swelling.
3. No evidence of fracture or subluxation along the cervical spine.
4. Marked soft tissue swelling about the left orbit. Mild soft
tissue swelling overlying the left maxilla and at the lips.

## 2014-12-20 ENCOUNTER — Emergency Department (HOSPITAL_COMMUNITY)
Admission: EM | Admit: 2014-12-20 | Discharge: 2014-12-20 | Disposition: A | Discharge status: Home / Self Care | Payer: Self-pay | Attending: Emergency Medicine | Admitting: Emergency Medicine

## 2014-12-20 ENCOUNTER — Encounter (HOSPITAL_COMMUNITY): Payer: Self-pay

## 2014-12-20 DIAGNOSIS — Z791 Long term (current) use of non-steroidal anti-inflammatories (NSAID): Secondary | ICD-10-CM | POA: Insufficient documentation

## 2014-12-20 DIAGNOSIS — F10120 Alcohol abuse with intoxication, uncomplicated: Secondary | ICD-10-CM | POA: Insufficient documentation

## 2014-12-20 DIAGNOSIS — Z23 Encounter for immunization: Secondary | ICD-10-CM | POA: Insufficient documentation

## 2014-12-20 DIAGNOSIS — F10920 Alcohol use, unspecified with intoxication, uncomplicated: Secondary | ICD-10-CM

## 2014-12-20 DIAGNOSIS — W268XXA Contact with other sharp object(s), not elsewhere classified, initial encounter: Secondary | ICD-10-CM | POA: Insufficient documentation

## 2014-12-20 DIAGNOSIS — Y9389 Activity, other specified: Secondary | ICD-10-CM | POA: Insufficient documentation

## 2014-12-20 DIAGNOSIS — Y998 Other external cause status: Secondary | ICD-10-CM | POA: Insufficient documentation

## 2014-12-20 DIAGNOSIS — Y9289 Other specified places as the place of occurrence of the external cause: Secondary | ICD-10-CM | POA: Insufficient documentation

## 2014-12-20 DIAGNOSIS — S61511A Laceration without foreign body of right wrist, initial encounter: Secondary | ICD-10-CM | POA: Insufficient documentation

## 2014-12-20 MED ORDER — TETANUS-DIPHTH-ACELL PERTUSSIS 5-2.5-18.5 LF-MCG/0.5 IM SUSP
0.5000 mL | Freq: Once | INTRAMUSCULAR | Status: AC
  Administered 2014-12-20: 0.5 mL via INTRAMUSCULAR
  Filled 2014-12-20: qty 0.5

## 2014-12-20 MED ORDER — BACITRACIN ZINC 500 UNIT/GM EX OINT
TOPICAL_OINTMENT | CUTANEOUS | Status: AC
  Filled 2014-12-20: qty 0.9

## 2014-12-20 MED ORDER — POVIDONE-IODINE 10 % EX SOLN
CUTANEOUS | Status: AC
  Filled 2014-12-20: qty 118

## 2014-12-20 MED ORDER — LIDOCAINE-EPINEPHRINE (PF) 2 %-1:200000 IJ SOLN
10.0000 mL | Freq: Once | INTRAMUSCULAR | Status: AC
  Administered 2014-12-20: 10 mL via INTRADERMAL
  Filled 2014-12-20: qty 20

## 2014-12-20 NOTE — ED Notes (Signed)
Went into pt's room, pt not in room, called registration who advised that pt had walked back into the er and used the restroom then left again,
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
## 2014-12-20 NOTE — ED Notes (Signed)
Dr Ward at bedside,  
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
## 2014-12-20 NOTE — Discharge Instructions (Signed)
Alcohol Intoxication Alcohol intoxication occurs when the amount of alcohol that a person has consumed impairs his or her ability to mentally and physically function. Alcohol directly impairs the normal chemical activity of the brain. Drinking large amounts of alcohol can lead to changes in mental function and behavior, and it can cause many physical effects that can be harmful.  Alcohol intoxication can range in severity from mild to very severe. Various factors can affect the level of intoxication that occurs, such as the person's age, gender, weight, frequency of alcohol consumption, and the presence of other medical conditions (such as diabetes, seizures, or heart conditions). Dangerous levels of alcohol intoxication may occur when people drink large amounts of alcohol in a short period (binge drinking). Alcohol can also be especially dangerous when combined with certain prescription medicines or "recreational" drugs. SIGNS AND SYMPTOMS Some common signs and symptoms of mild alcohol intoxication include:  Loss of coordination.  Changes in mood and behavior.  Impaired judgment.  Slurred speech. As alcohol intoxication progresses to more severe levels, other signs and symptoms will appear. These may include:  Vomiting.  Confusion and impaired memory.  Slowed breathing.  Seizures.  Loss of consciousness. DIAGNOSIS  Your health care provider will take a medical history and perform a physical exam. You will be asked about the amount and type of alcohol you have consumed. Blood tests will be done to measure the concentration of alcohol in your blood. In many places, your blood alcohol level must be lower than 80 mg/dL (1.61%0.08%) to legally drive. However, many dangerous effects of alcohol can occur at much lower levels.  TREATMENT  People with alcohol intoxication often do not require treatment. Most of the effects of alcohol intoxication are temporary, and they go away as the alcohol naturally  leaves the body. Your health care provider will monitor your condition until you are stable enough to go home. Fluids are sometimes given through an IV access tube to help prevent dehydration.  HOME CARE INSTRUCTIONS  Do not drive after drinking alcohol.  Stay hydrated. Drink enough water and fluids to keep your urine clear or pale yellow. Avoid caffeine.   Only take over-the-counter or prescription medicines as directed by your health care provider.  SEEK MEDICAL CARE IF:   You have persistent vomiting.   You do not feel better after a few days.  You have frequent alcohol intoxication. Your health care provider can help determine if you should see a substance use treatment counselor. SEEK IMMEDIATE MEDICAL CARE IF:   You become shaky or tremble when you try to stop drinking.   You shake uncontrollably (seizure).   You throw up (vomit) blood. This may be bright red or may look like black coffee grounds.   You have blood in your stool. This may be bright red or may appear as a black, tarry, bad smelling stool.   You become lightheaded or faint.  MAKE SURE YOU:   Understand these instructions.  Will watch your condition.  Will get help right away if you are not doing well or get worse.   This information is not intended to replace advice given to you by your health care provider. Make sure you discuss any questions you have with your health care provider.   Document Released: 11/17/2004 Document Revised: 10/10/2012 Document Reviewed: 07/13/2012 Elsevier Interactive Patient Education 2016 [employer]  Laceration Care, Adult A laceration is a cut that goes through all of the layers of the skin and into the  tissue that is right under the skin. Some lacerations heal on their own. Others need to be closed with stitches (sutures), staples, skin adhesive strips, or skin glue. Proper laceration care minimizes the risk of infection and helps the laceration to heal better. HOW  TO CARE FOR YOUR LACERATION If sutures or staples were used:  Keep the wound clean and dry.  If you were given a bandage (dressing), you should change it at least one time per day or as told by your health care provider. You should also change it if it becomes wet or dirty.  Keep the wound completely dry for the first 24 hours or as told by your health care provider. After that time, you may shower or bathe. However, make sure that the wound is not soaked in water until after the sutures or staples have been removed.  Clean the wound one time each day or as told by your health care provider:  Wash the wound with soap and water.  Rinse the wound with water to remove all soap.  Pat the wound dry with a clean towel. Do not rub the wound.  After cleaning the wound, apply a thin layer of antibiotic ointmentas told by your health care provider. This will help to prevent infection and keep the dressing from sticking to the wound.  Have the sutures or staples removed as told by your health care provider. If skin adhesive strips were used:  Keep the wound clean and dry.  If you were given a bandage (dressing), you should change it at least one time per day or as told by your health care provider. You should also change it if it becomes dirty or wet.  Do not get the skin adhesive strips wet. You may shower or bathe, but be careful to keep the wound dry.  If the wound gets wet, pat it dry with a clean towel. Do not rub the wound.  Skin adhesive strips fall off on their own. You may trim the strips as the wound heals. Do not remove skin adhesive strips that are still stuck to the wound. They will fall off in time. If skin glue was used:  Try to keep the wound dry, but you may briefly wet it in the shower or bath. Do not soak the wound in water, such as by swimming.  After you have showered or bathed, gently pat the wound dry with a clean towel. Do not rub the wound.  Do not do any  activities that will make you sweat heavily until the skin glue has fallen off on its own.  Do not apply liquid, cream, or ointment medicine to the wound while the skin glue is in place. Using those may loosen the film before the wound has healed.  If you were given a bandage (dressing), you should change it at least one time per day or as told by your health care provider. You should also change it if it becomes dirty or wet.  If a dressing is placed over the wound, be careful not to apply tape directly over the skin glue. Doing that may cause the glue to be pulled off before the wound has healed.  Do not pick at the glue. The skin glue usually remains in place for 5-10 days, then it falls off of the skin. General Instructions  Take over-the-counter and prescription medicines only as told by your health care provider.  If you were prescribed an antibiotic medicine or ointment, take  or apply it as told by your doctor. Do not stop using it even if your condition improves.  To help prevent scarring, make sure to cover your wound with sunscreen whenever you are outside after stitches are removed, after adhesive strips are removed, or when glue remains in place and the wound is healed. Make sure to wear a sunscreen of at least 30 SPF.  Do not scratch or pick at the wound.  Keep all follow-up visits as told by your health care provider. This is important.  Check your wound every day for signs of infection. Watch for:  Redness, swelling, or pain.  Fluid, blood, or pus.  Raise (elevate) the injured area above the level of your heart while you are sitting or lying down, if possible. SEEK MEDICAL CARE IF:  You received a tetanus shot and you have swelling, severe pain, redness, or bleeding at the injection site.  You have a fever.  A wound that was closed breaks open.  You notice a bad smell coming from your wound or your dressing.  You notice something coming out of the wound, such as  wood or glass.  Your pain is not controlled with medicine.  You have increased redness, swelling, or pain at the site of your wound.  You have fluid, blood, or pus coming from your wound.  You notice a change in the color of your skin near your wound.  You need to change the dressing frequently due to fluid, blood, or pus draining from the wound.  You develop a new rash.  You develop numbness around the wound. SEEK IMMEDIATE MEDICAL CARE IF:  You develop severe swelling around the wound.  Your pain suddenly increases and is severe.  You develop painful lumps near the wound or on skin that is anywhere on your body.  You have a red streak going away from your wound.  The wound is on your hand or foot and you cannot properly move a finger or toe.  The wound is on your hand or foot and you notice that your fingers or toes look pale or bluish.   This information is not intended to replace advice given to you by your health care provider. Make sure you discuss any questions you have with your health care provider.   Document Released: 02/07/2005 Document Revised: 06/24/2014 Document Reviewed: 02/03/2014 Elsevier Interactive Patient Education [employer]

## 2015-12-16 ENCOUNTER — Emergency Department (HOSPITAL_COMMUNITY): Payer: Self-pay

## 2015-12-16 ENCOUNTER — Encounter (HOSPITAL_COMMUNITY): Payer: Self-pay | Admitting: *Deleted

## 2015-12-16 ENCOUNTER — Emergency Department (HOSPITAL_COMMUNITY)
Admission: EM | Admit: 2015-12-16 | Discharge: 2015-12-16 | Disposition: A | Discharge status: Home / Self Care | Payer: Self-pay | Attending: Emergency Medicine | Admitting: Emergency Medicine

## 2015-12-16 DIAGNOSIS — F17210 Nicotine dependence, cigarettes, uncomplicated: Secondary | ICD-10-CM | POA: Insufficient documentation

## 2015-12-16 DIAGNOSIS — K852 Alcohol induced acute pancreatitis without necrosis or infection: Secondary | ICD-10-CM | POA: Insufficient documentation

## 2015-12-16 LAB — CBC WITH DIFFERENTIAL/PLATELET
BASOS ABS: 0 10*3/uL (ref 0.0–0.1)
Basophils Relative: 0 %
EOS PCT: 0 %
Eosinophils Absolute: 0 10*3/uL (ref 0.0–0.7)
HCT: 49.2 % (ref 39.0–52.0)
Hemoglobin: 18 g/dL — ABNORMAL HIGH (ref 13.0–17.0)
LYMPHS PCT: 8 %
Lymphs Abs: 0.9 10*3/uL (ref 0.7–4.0)
MCH: 32.8 pg (ref 26.0–34.0)
MCHC: 36.6 g/dL — AB (ref 30.0–36.0)
MCV: 89.8 fL (ref 78.0–100.0)
MONO ABS: 0.6 10*3/uL (ref 0.1–1.0)
MONOS PCT: 5 %
Neutro Abs: 10 10*3/uL — ABNORMAL HIGH (ref 1.7–7.7)
Neutrophils Relative %: 87 %
PLATELETS: 255 10*3/uL (ref 150–400)
RBC: 5.48 MIL/uL (ref 4.22–5.81)
RDW: 12.8 % (ref 11.5–15.5)
WBC: 11.5 10*3/uL — ABNORMAL HIGH (ref 4.0–10.5)

## 2015-12-16 LAB — COMPREHENSIVE METABOLIC PANEL
ALT: 59 U/L (ref 17–63)
ANION GAP: 12 (ref 5–15)
AST: 44 U/L — AB (ref 15–41)
Albumin: 5 g/dL (ref 3.5–5.0)
Alkaline Phosphatase: 82 U/L (ref 38–126)
BUN: 8 mg/dL (ref 6–20)
CHLORIDE: 94 mmol/L — AB (ref 101–111)
CO2: 27 mmol/L (ref 22–32)
Calcium: 10 mg/dL (ref 8.9–10.3)
Creatinine, Ser: 0.94 mg/dL (ref 0.61–1.24)
Glucose, Bld: 120 mg/dL — ABNORMAL HIGH (ref 65–99)
POTASSIUM: 3.5 mmol/L (ref 3.5–5.1)
Sodium: 133 mmol/L — ABNORMAL LOW (ref 135–145)
TOTAL PROTEIN: 8.9 g/dL — AB (ref 6.5–8.1)
Total Bilirubin: 1 mg/dL (ref 0.3–1.2)

## 2015-12-16 LAB — LIPASE, BLOOD: LIPASE: 1532 U/L — AB (ref 11–51)

## 2015-12-16 MED ORDER — MORPHINE SULFATE (PF) 4 MG/ML IV SOLN
4.0000 mg | Freq: Once | INTRAVENOUS | Status: AC
  Administered 2015-12-16: 4 mg via INTRAVENOUS
  Filled 2015-12-16: qty 1

## 2015-12-16 MED ORDER — ONDANSETRON HCL 4 MG/2ML IJ SOLN
4.0000 mg | Freq: Once | INTRAMUSCULAR | Status: AC
  Administered 2015-12-16: 4 mg via INTRAVENOUS
  Filled 2015-12-16: qty 2

## 2015-12-16 MED ORDER — GI COCKTAIL ~~LOC~~
30.0000 mL | Freq: Once | ORAL | Status: AC
  Administered 2015-12-16: 30 mL via ORAL
  Filled 2015-12-16: qty 30

## 2015-12-16 MED ORDER — SODIUM CHLORIDE 0.9 % IV BOLUS (SEPSIS)
1000.0000 mL | Freq: Once | INTRAVENOUS | Status: AC
  Administered 2015-12-16: 1000 mL via INTRAVENOUS

## 2015-12-16 MED ORDER — OXYCODONE-ACETAMINOPHEN 5-325 MG PO TABS: 1.0000 | ORAL_TABLET | Freq: Four times a day (QID) | ORAL | 0 refills | Status: AC | PRN

## 2015-12-16 MED ORDER — ONDANSETRON HCL 4 MG PO TABS: 4.0000 mg | ORAL_TABLET | Freq: Three times a day (TID) | ORAL | 0 refills | Status: AC | PRN

## 2015-12-16 NOTE — ED Provider Notes (Signed)
[facility]-EMERGENCY DEPT Provider Note   CSN: [id number] Arrival date & time: 12/16/15  0645     History   Chief Complaint Chief Complaint  Patient presents with  . Abdominal Pain    HPI BURNIS Kevin Byrd is a 25 y.o. male.  HPI  25 year old male who presents with abdominal pain. States he has a "stomach bug" for a few days, which has decreased his appetite. Complains of constipation and now vomiting this morning.  Last BM 2 days ago.  Vomiting started this morning, greenish discoloration.  His abdomen has been hurting since Friday, constant over past few days. Intermittent pain initially on Friday, improved after ibuprofen. Pain localized to the epigastrium, and he unable to describe type of pain.  Having subjective fever and chills.  No diarrhea, sick contacts, dysuria, urinary frequency. Taking pepto bismol, ibuprofen and tums, without any real good effect.  States ate pizza from [place] prior to onset of pain probably. Also endorses daily alcohol intake of a few beers and shots, nearly daily. Has not been drinking over past 2 days, and denies withdrawal symptoms.  History reviewed. No pertinent past medical history.  There are no active problems to display for this patient.   History reviewed. No pertinent surgical history.     Home Medications    Prior to Admission medications   Medication Sig Start Date End Date Taking? Authorizing Provider  naproxen (NAPROSYN) 500 MG tablet Take 1 tablet (500 mg total) by mouth 2 (two) times daily with a meal. 02/15/13   Eber Hong, MD  ondansetron (ZOFRAN) 4 MG tablet Take 1 tablet (4 mg total) by mouth every 8 (eight) hours as needed for nausea or vomiting. 12/16/15   [name], MD  oxyCODONE-acetaminophen (PERCOCET/ROXICET) 5-325 MG tablet Take 1-2 tablets by mouth every 6 (six) hours as needed for moderate pain or severe pain. 12/16/15   [name], MD    Family History History reviewed. No pertinent family  history.  Social History Social History  Substance Use Topics  . Smoking status: Current Every Day Smoker    Types: Cigarettes  . Smokeless tobacco: Never Used  . Alcohol use Yes     Comment: occ     Allergies   Bee venom   Review of Systems Review of Systems  10/14 systems reviewed and are negative other than those stated in the HPI    Physical Exam Updated Vital Signs BP 143/88 (BP Location: Left Arm)   Pulse 60   Temp 98.7 F (37.1 C) (Oral)   Resp 18   Ht 6' (1.829 m)   Wt 180 lb (81.6 kg)   SpO2 100%   BMI 24.41 kg/m   Physical Exam Physical Exam  Nursing note and vitals reviewed. Constitutional: Well developed, well nourished, non-toxic, and in no acute distress Head: Normocephalic and atraumatic.  Mouth/Throat: Oropharynx is clear and mucous membranes dry.  Neck: Normal range of motion. Neck supple.  Cardiovascular: Normal rate and regular rhythm.   Pulmonary/Chest: Effort normal and breath sounds normal.  Abdominal: Soft. There is epigastric tenderness. There is no rebound and no guarding. no tenderness at McBurney's point. No Murphy's sign. Musculoskeletal: Normal range of motion.  Neurological: Alert, no facial droop, fluent speech, moves all extremities symmetrically Skin: Skin is warm and dry.  Psychiatric: Cooperative   ED Treatments / Results  Labs (all labs ordered are listed, but only abnormal results are displayed) Labs Reviewed  CBC WITH DIFFERENTIAL/PLATELET - Abnormal; Notable for  the following:       Result Value   WBC 11.5 (*)    Hemoglobin 18.0 (*)    MCHC 36.6 (*)    Neutro Abs 10.0 (*)    All other components within normal limits  COMPREHENSIVE METABOLIC PANEL - Abnormal; Notable for the following:    Sodium 133 (*)    Chloride 94 (*)    Glucose, Bld 120 (*)    Total Protein 8.9 (*)    AST 44 (*)    All other components within normal limits  LIPASE, BLOOD - Abnormal; Notable for the following:    Lipase 1,532 (*)    All  other components within normal limits    EKG  EKG Interpretation None       Radiology Dg Abdomen Acute W/chest  Result Date: 12/16/2015 CLINICAL DATA:  Pain across top of stomach for several days. Vomiting. EXAM: DG ABDOMEN ACUTE W/ 1V CHEST COMPARISON:  Chest radiograph 09/27/2010. FINDINGS: There is no evidence of dilated bowel loops or free intraperitoneal air. No radiopaque calculi or other significant radiographic abnormality is seen. Heart size and mediastinal contours are within normal limits. Both lungs are clear. IMPRESSION: Negative abdominal radiographs.  No acute cardiopulmonary disease. Electronically Signed   By: Elsie StainJohn T Curnes M.D.   On: 12/16/2015 08:44    Procedures Procedures (including critical care time)  Medications Ordered in ED Medications  sodium chloride 0.9 % bolus 1,000 mL (0 mLs Intravenous Stopped 12/16/15 0919)  ondansetron (ZOFRAN) injection 4 mg (4 mg Intravenous Given 12/16/15 0752)  gi cocktail (Maalox,Lidocaine,Donnatal) (30 mLs Oral Given 12/16/15 0753)  morphine 4 MG/ML injection 4 mg (4 mg Intravenous Given 12/16/15 0842)     Initial Impression / Assessment and Plan / ED Course  I have reviewed the triage vital signs and the nursing notes.  Pertinent labs & imaging results that were available during my care of the patient were reviewed by me and considered in my medical decision making (see chart for details).  Clinical Course    Presenting with 2-3 days of epigastric abdominal pain with nausea and vomiting this morning. Is in no acute distress and not toxic. Vital signs within normal limits. With epigastric abdominal tenderness to palpation, but non-surgical abdomen. Lipase 1532, c/w pancreatitis. Likely alcoholic pancreatitis given history of EtOH abuse daily. No history suggestive of biliary colic, no RUQ abdominal pain, and no obstructive pathology on CMP to suggest gallstone pancreatitis at this time. Remainder of blood work reassuring.  XR abd visualized w/o obstructive pattern or free air. He has no systemic signs or symptoms of illness. His pain is controlled after 4 mg morphine and he is able to tolerate clear fluids. At this time, he does not require admission for pancreatitis. Discussed bowel rest, clear fluids with slow advancement of diet. Discuss alcohol abstinence and role of alcohol in his disease presentation. Discussed supportive management with analgesics and antiemetics. Discussed strict return instructions that would warrant admission or re-evaluation. He expressed understanding of all discharge instructions and felt comfortable with the plan of care.   Final Clinical Impressions(s) / ED Diagnoses   Final diagnoses:  Alcohol-induced acute pancreatitis, unspecified complication status    New Prescriptions Discharge Medication List as of 12/16/2015  9:47 AM       Lavera Guiseana Duo Jovanni Eckhart, MD 12/16/15 2112

## 2015-12-16 NOTE — Discharge Instructions (Signed)
You have acute pancreatitis likely related to alcohol intake. This is inflammation of the gallbladder. The treatment is to let the inflammation die down on it's own over time.  If you reduce or quit alcohol intake, you can likely avoid this problem again in the future.  Take pain medication and nausea medications as prescribed.  For the first few days, give yourself bowel rest and drink clear fluids to keep well hydrated. Then slowly reintroduce  low fat diet (see hand out).  Return for worsening symptoms, including fever, difficulty breathing, worsening pain, intractable vomiting or any other symptoms concerning to you.

## 2015-12-16 NOTE — ED Notes (Signed)
MD at bedside. 

## 2015-12-16 NOTE — ED Notes (Signed)
Patient was given a Sprite

## 2015-12-16 NOTE — ED Notes (Signed)
Pt had episode of vomiting while nurse at bedside

## 2015-12-16 NOTE — ED Triage Notes (Signed)
Pt c/o abdominal pain since Friday. Pt states he hasn't had a BM in 2 days and had an episode of emesis just pta.

## 2017-05-16 DIAGNOSIS — F17210 Nicotine dependence, cigarettes, uncomplicated: Secondary | ICD-10-CM | POA: Insufficient documentation

## 2017-05-16 DIAGNOSIS — R369 Urethral discharge, unspecified: Secondary | ICD-10-CM | POA: Insufficient documentation

## 2017-05-16 DIAGNOSIS — Z79899 Other long term (current) drug therapy: Secondary | ICD-10-CM | POA: Insufficient documentation

## 2017-05-16 DIAGNOSIS — Z202 Contact with and (suspected) exposure to infections with a predominantly sexual mode of transmission: Secondary | ICD-10-CM | POA: Insufficient documentation

## 2017-05-17 ENCOUNTER — Other Ambulatory Visit: Payer: Self-pay

## 2017-05-17 ENCOUNTER — Encounter (HOSPITAL_COMMUNITY): Payer: Self-pay | Admitting: *Deleted

## 2017-05-17 ENCOUNTER — Emergency Department (HOSPITAL_COMMUNITY)
Admission: EM | Admit: 2017-05-17 | Discharge: 2017-05-17 | Disposition: A | Discharge status: Home / Self Care | Payer: Self-pay | Attending: Emergency Medicine | Admitting: Emergency Medicine

## 2017-05-17 DIAGNOSIS — Z202 Contact with and (suspected) exposure to infections with a predominantly sexual mode of transmission: Secondary | ICD-10-CM

## 2017-05-17 DIAGNOSIS — R369 Urethral discharge, unspecified: Secondary | ICD-10-CM

## 2017-05-17 MED ORDER — CEFTRIAXONE SODIUM 250 MG IJ SOLR
250.0000 mg | Freq: Once | INTRAMUSCULAR | Status: AC
  Administered 2017-05-17: 250 mg via INTRAMUSCULAR
  Filled 2017-05-17: qty 250

## 2017-05-17 MED ORDER — AZITHROMYCIN 250 MG PO TABS
1000.0000 mg | ORAL_TABLET | Freq: Once | ORAL | Status: AC
  Administered 2017-05-17: 1000 mg via ORAL
  Filled 2017-05-17: qty 4

## 2017-05-17 MED ORDER — LIDOCAINE HCL (PF) 1 % IJ SOLN
INTRAMUSCULAR | Status: AC
  Filled 2017-05-17: qty 2

## 2017-05-17 NOTE — ED Triage Notes (Signed)
Pt states he was told one of his sexual partners was recently dx with chlamydia and he wants to be treated; pt states he has some discharge from his penis

## 2017-05-17 NOTE — ED Provider Notes (Signed)
[facility]ENCY DEPARTMENT Provider Note   CSN: [id number] Arrival date & time: 05/16/17  2318     History   Chief Complaint Chief Complaint  Patient presents with  . SEXUALLY TRANSMITTED DISEASE    HPI Kevin Byrd is a 27 y.o. male.  HPI  This is a 27 year old male who presents with concerns for an STD.  Patient reports that he was sexually active with a male who informed him that she was diagnosed with chlamydia.  Patient has noted some penile discharge.  He has a remote history of STDs.  He reports condom use but "I may have messed up."  Denies any groin swelling or other concerns.  Denies any dysuria.  History reviewed. No pertinent past medical history.  There are no active problems to display for this patient.   History reviewed. No pertinent surgical history.      Home Medications    Prior to Admission medications   Medication Sig Start Date End Date Taking? Authorizing Provider  naproxen (NAPROSYN) 500 MG tablet Take 1 tablet (500 mg total) by mouth 2 (two) times daily with a meal. 02/15/13   Eber HongMiller, Brian, MD  ondansetron (ZOFRAN) 4 MG tablet Take 1 tablet (4 mg total) by mouth every 8 (eight) hours as needed for nausea or vomiting. 12/16/15   Lavera GuiseLiu, Dana Duo, MD  oxyCODONE-acetaminophen (PERCOCET/ROXICET) 5-325 MG tablet Take 1-2 tablets by mouth every 6 (six) hours as needed for moderate pain or severe pain. 12/16/15   Lavera GuiseLiu, Dana Duo, MD    Family History History reviewed. No pertinent family history.  Social History Social History   Tobacco Use  . Smoking status: Current Every Day Smoker    Packs/day: 1.00    Types: Cigarettes  . Smokeless tobacco: Never Used  Substance Use Topics  . Alcohol use: Yes    Comment: occ  . Drug use: No     Allergies   Bee venom   Review of Systems Review of Systems  Constitutional: Negative for fever.  Genitourinary: Positive for discharge. Negative for dysuria, penile pain and penile swelling.  All  other systems reviewed and are negative.    Physical Exam Updated Vital Signs BP 124/84 (BP Location: Right Arm)   Pulse (!) 56   Temp 99 F (37.2 C) (Oral)   Resp 18   Ht 6' (1.829 m)   Wt 81.6 kg (180 lb)   SpO2 99%   BMI 24.41 kg/m   Physical Exam  Constitutional: He is oriented to person, place, and time. He appears well-developed and well-nourished. No distress.  HENT:  Head: Normocephalic and atraumatic.  Cardiovascular: Normal rate and regular rhythm.  Pulmonary/Chest: Effort normal. No respiratory distress.  Genitourinary:  Genitourinary Comments: Normal circumcised penis, no obvious discharge, no lesions noted  Musculoskeletal: He exhibits no edema.  Neurological: He is alert and oriented to person, place, and time.  Skin: Skin is warm and dry.  Psychiatric: He has a normal mood and affect.  Nursing note and vitals reviewed.    ED Treatments / Results  Labs (all labs ordered are listed, but only abnormal results are displayed) Labs Reviewed  [facility]    EKG None  Radiology No results found.  Procedures Procedures (including critical care time)  Medications Ordered in ED Medications  lidocaine (PF) (XYLOCAINE) 1 % injection (has no administration in time range)  cefTRIAXone (ROCEPHIN) injection 250 mg (250 mg Intramuscular Given 05/17/17 0118)  azithromycin (ZITHROMAX)  tablet 1,000 mg (1,000 mg Oral Given 05/17/17 0118)     Initial Impression / Assessment and Plan / ED Course  I have reviewed the triage vital signs and the nursing notes.  Pertinent labs & imaging results that were available during my care of the patient were reviewed by me and considered in my medical decision making (see chart for details).     Presents with concerns for STDs.  Known positive contact.  Patient was tested and empirically treated for gonorrhea and chlamydia.  He was educated regarding safe sex practices.  Recommend abstaining  from sexual activity for the next 10 days.  Inform all partners.  After history, exam, and medical workup I feel the patient has been appropriately medically screened and is safe for discharge home. Pertinent diagnoses were discussed with the patient. Patient was given return precautions.   Final Clinical Impressions(s) / ED Diagnoses   Final diagnoses:  Exposure to sexually transmitted disease (STD)  Penile discharge    ED Discharge Orders    None       Mikeila Burgen, Mayer Masker, MD 05/17/17 0120

## 2017-05-17 NOTE — Discharge Instructions (Addendum)
You were seen today with concerns for sexually transmitted disease.  You were tested and treated.  You need to abstain from sexual activity for the next 10 days.  Always wear condoms.

## 2017-05-18 LAB — GC/CHLAMYDIA PROBE AMP (~~LOC~~) NOT AT ARMC
CHLAMYDIA, DNA PROBE: POSITIVE — AB
Neisseria Gonorrhea: NEGATIVE

## 2017-08-19 IMAGING — DX DG ABDOMEN ACUTE W/ 1V CHEST
3 series · 3 of 3 positions shown · non-contrast
Comparison: Chest radiograph 09/27/2010.

CLINICAL DATA: Pain across top of stomach for several days.
Vomiting.

EXAM:
DG ABDOMEN ACUTE W/ 1V CHEST

[chest pa]
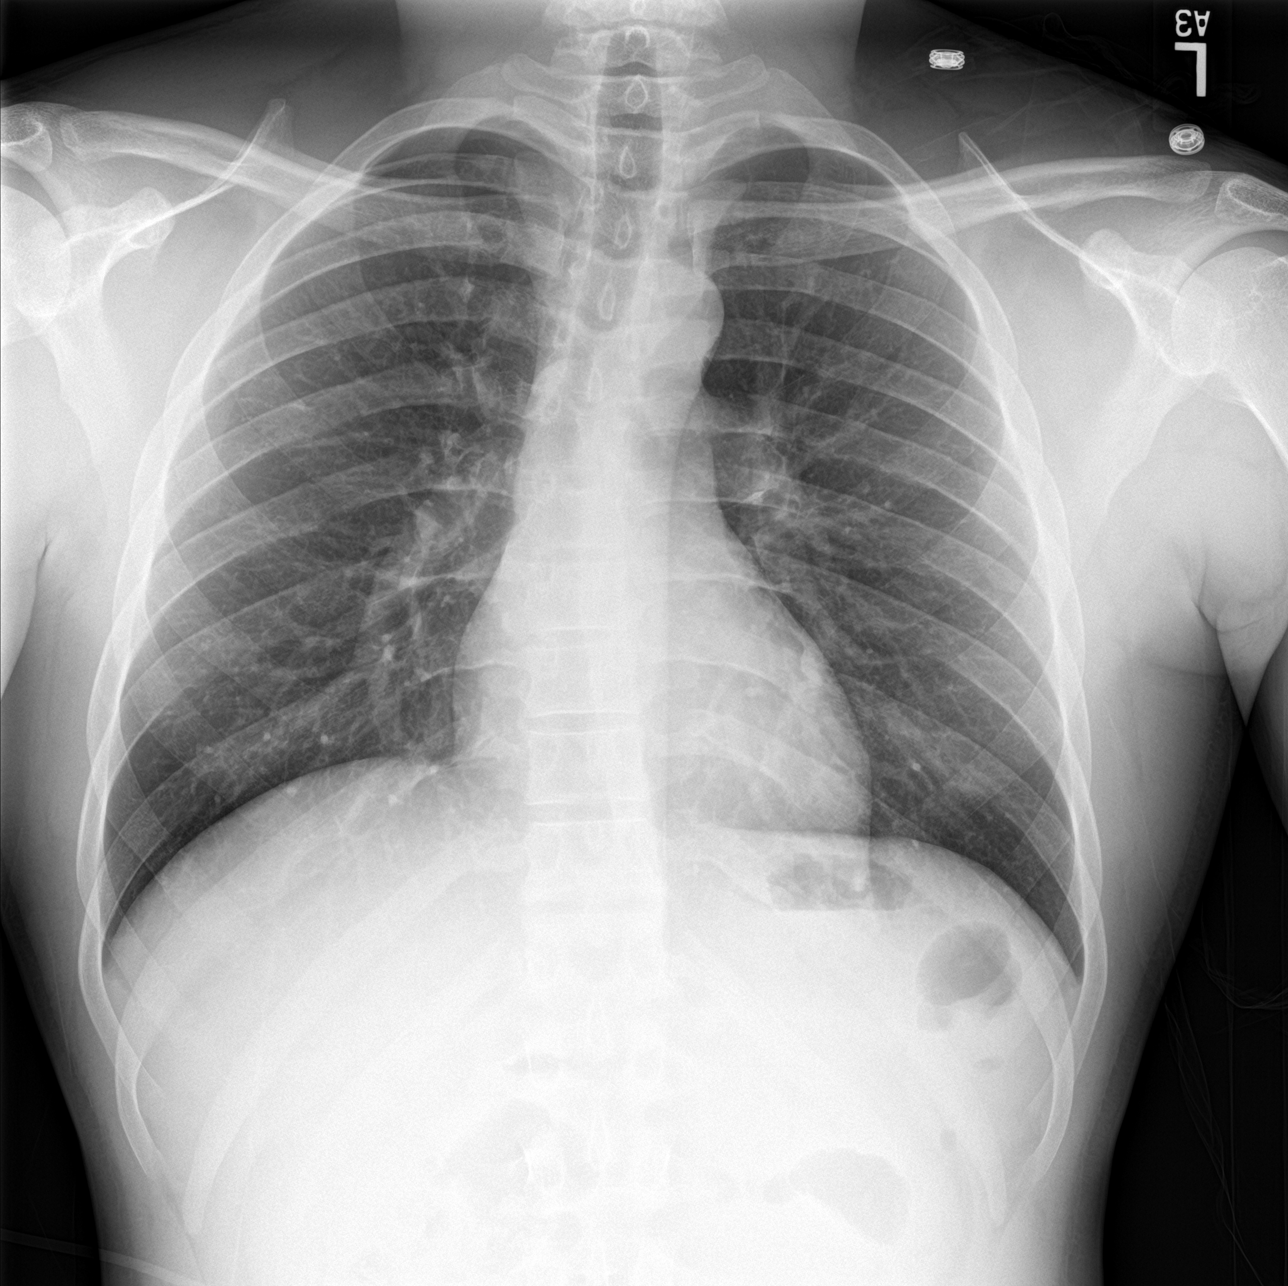

[abdomen erect]
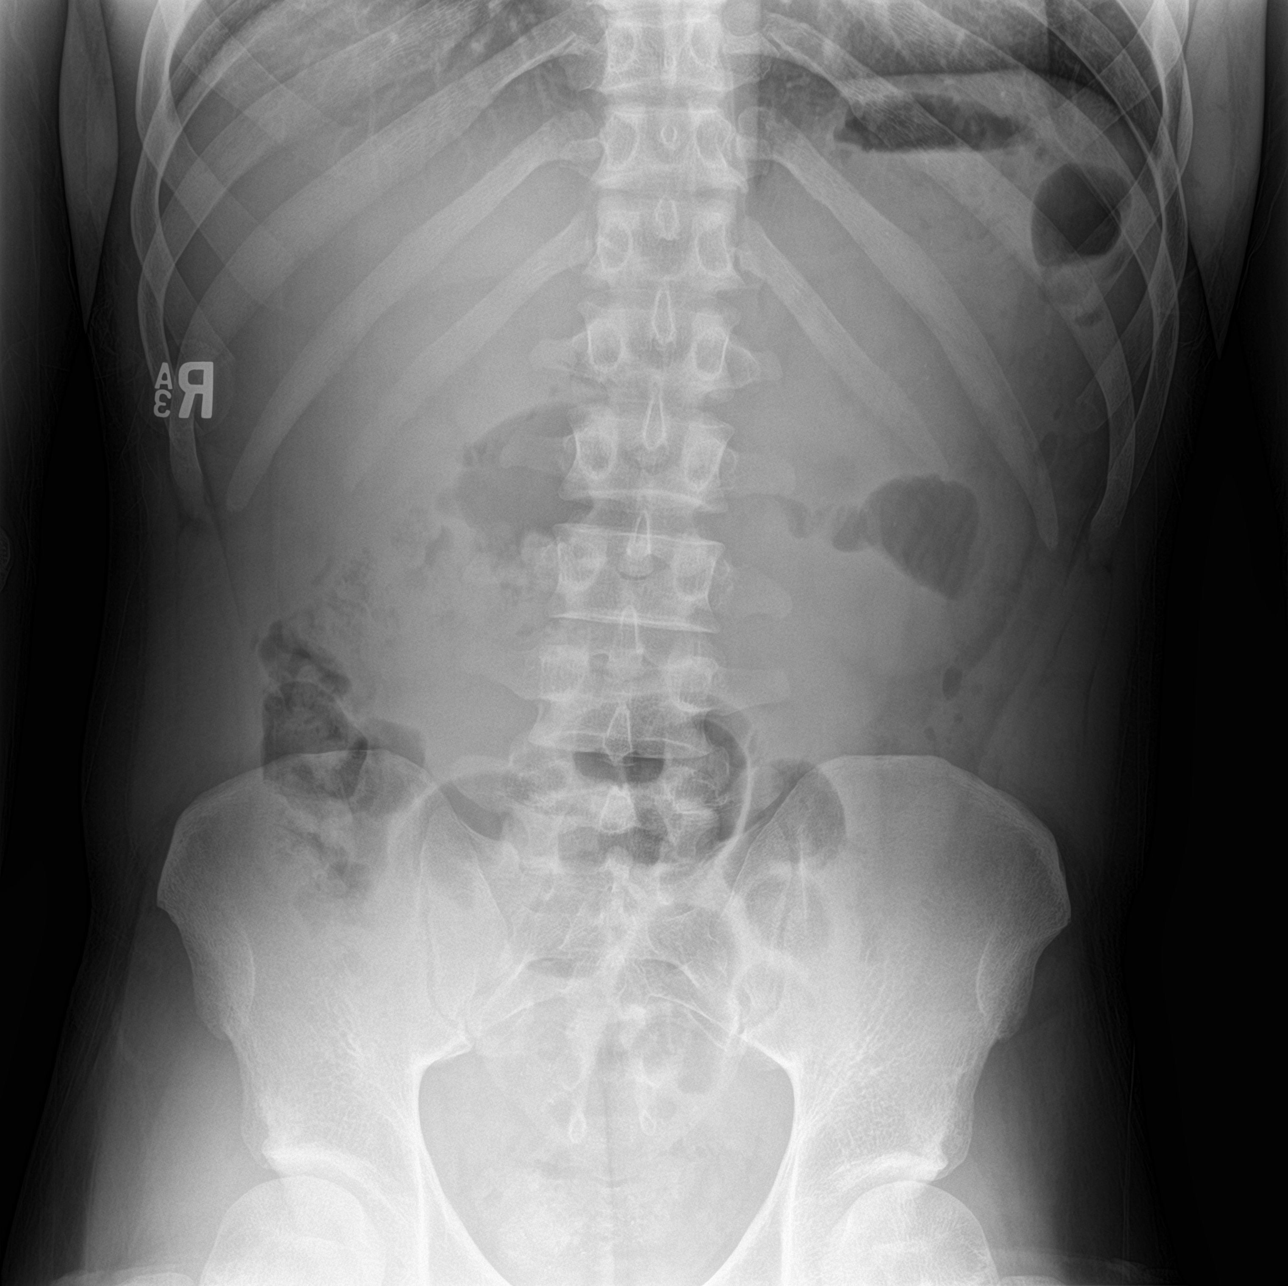

[abdomen supine]
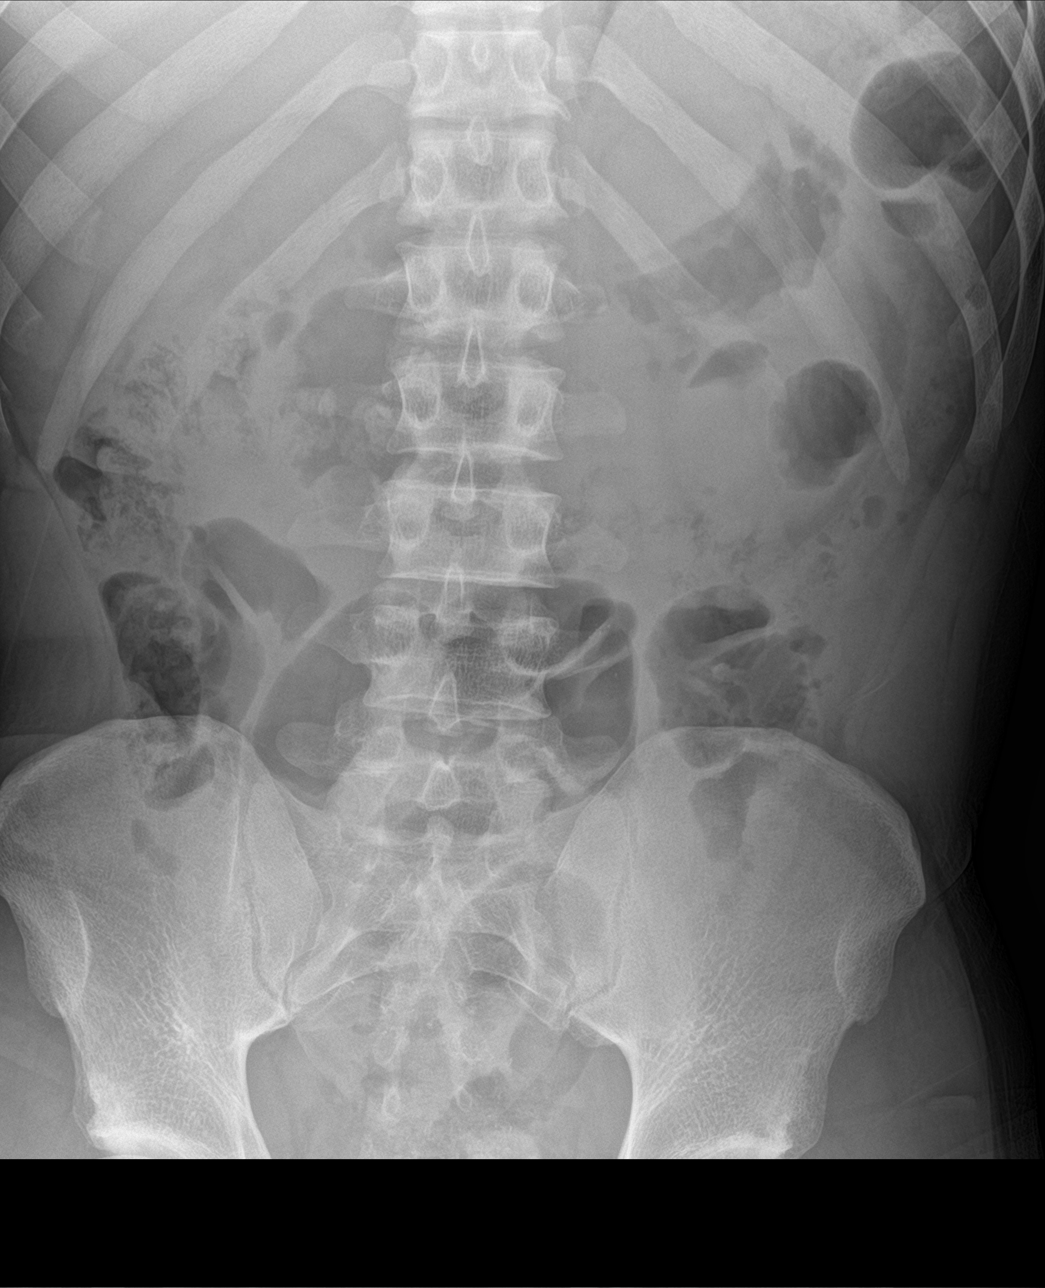

[3 of 3 positions shown; findings below may reference images not displayed]

FINDINGS: There is no evidence of dilated bowel loops or free intraperitoneal
air. No radiopaque calculi or other significant radiographic
abnormality is seen. Heart size and mediastinal contours are within
normal limits. Both lungs are clear.
IMPRESSION: Negative abdominal radiographs.  No acute cardiopulmonary disease.

## 2020-06-04 ENCOUNTER — Encounter (HOSPITAL_COMMUNITY): Payer: Self-pay | Admitting: *Deleted

## 2020-06-04 ENCOUNTER — Other Ambulatory Visit: Payer: Self-pay

## 2020-06-04 ENCOUNTER — Emergency Department (HOSPITAL_COMMUNITY)
Admission: EM | Admit: 2020-06-04 | Discharge: 2020-06-04 | Disposition: A | Discharge status: Home / Self Care | Payer: No Typology Code available for payment source | Attending: Emergency Medicine | Admitting: Emergency Medicine

## 2020-06-04 DIAGNOSIS — M549 Dorsalgia, unspecified: Secondary | ICD-10-CM

## 2020-06-04 DIAGNOSIS — Y92410 Unspecified street and highway as the place of occurrence of the external cause: Secondary | ICD-10-CM | POA: Insufficient documentation

## 2020-06-04 DIAGNOSIS — S3992XA Unspecified injury of lower back, initial encounter: Secondary | ICD-10-CM | POA: Diagnosis present

## 2020-06-04 DIAGNOSIS — F17210 Nicotine dependence, cigarettes, uncomplicated: Secondary | ICD-10-CM | POA: Diagnosis not present

## 2020-06-04 DIAGNOSIS — M542 Cervicalgia: Secondary | ICD-10-CM | POA: Insufficient documentation

## 2020-06-04 MED ORDER — METHOCARBAMOL 500 MG PO TABS: 500.0000 mg | ORAL_TABLET | Freq: Four times a day (QID) | ORAL | 0 refills | Status: DC

## 2020-06-04 MED ORDER — DICLOFENAC SODIUM 75 MG PO TBEC: 75.0000 mg | DELAYED_RELEASE_TABLET | Freq: Two times a day (BID) | ORAL | 0 refills | Status: DC
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
## 2021-09-09 ENCOUNTER — Encounter (HOSPITAL_COMMUNITY): Payer: Self-pay | Admitting: *Deleted

## 2021-09-09 ENCOUNTER — Other Ambulatory Visit: Payer: Self-pay

## 2021-09-09 ENCOUNTER — Emergency Department (HOSPITAL_COMMUNITY)
Admission: EM | Admit: 2021-09-09 | Discharge: 2021-09-09 | Disposition: A | Discharge status: Home / Self Care | Payer: Self-pay | Attending: Emergency Medicine | Admitting: Emergency Medicine

## 2021-09-09 DIAGNOSIS — L03116 Cellulitis of left lower limb: Secondary | ICD-10-CM | POA: Insufficient documentation

## 2021-09-09 MED ORDER — IBUPROFEN 600 MG PO TABS: 600.0000 mg | ORAL_TABLET | Freq: Three times a day (TID) | ORAL | 0 refills | Status: DC

## 2021-09-09 MED ORDER — LIDOCAINE HCL (PF) 1 % IJ SOLN
5.0000 mL | Freq: Once | INTRAMUSCULAR | Status: AC
  Administered 2021-09-09: 5 mL
  Filled 2021-09-09: qty 5

## 2021-09-09 MED ORDER — CEPHALEXIN 500 MG PO CAPS: 500.0000 mg | ORAL_CAPSULE | Freq: Four times a day (QID) | ORAL | 0 refills | Status: DC

## 2021-09-09 MED ORDER — IBUPROFEN 400 MG PO TABS
600.0000 mg | ORAL_TABLET | Freq: Once | ORAL | Status: AC
  Administered 2021-09-09: 600 mg via ORAL
  Filled 2021-09-09: qty 2
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
## 2021-09-09 NOTE — Discharge Instructions (Signed)
This antibiotic should start making a significant improvement in your area of pain and redness over the next 48 hours.  If it is not improving within that time period or you think your symptoms are worsening either with worse pain or advance spreading of this reddened area return here for recheck of your symptoms.  I do recommend warm compresses or soak in a hot tub of water 2-3 times daily for 10 minutes which can help hasten healing.  You may take ibuprofen which has been prescribed you which can also help with pain and swelling.

## 2021-09-09 NOTE — ED Notes (Addendum)
Went over discharge instructions, pt verbalized understanding. Bandaged pt's wound. RN forgot to obtain signature for discharge.

## 2021-09-10 ENCOUNTER — Emergency Department (HOSPITAL_COMMUNITY)
Admission: EM | Admit: 2021-09-10 | Discharge: 2021-09-11 | Discharge status: Against Medical Advice | Payer: Self-pay | Attending: Emergency Medicine | Admitting: Emergency Medicine

## 2021-09-10 ENCOUNTER — Other Ambulatory Visit: Payer: Self-pay

## 2021-09-10 DIAGNOSIS — Z5321 Procedure and treatment not carried out due to patient leaving prior to being seen by health care provider: Secondary | ICD-10-CM | POA: Insufficient documentation

## 2021-09-10 DIAGNOSIS — L03116 Cellulitis of left lower limb: Secondary | ICD-10-CM | POA: Insufficient documentation

## 2021-09-10 LAB — BASIC METABOLIC PANEL
Anion gap: 11 (ref 5–15)
BUN: 13 mg/dL (ref 6–20)
CO2: 26 mmol/L (ref 22–32)
Calcium: 9.2 mg/dL (ref 8.9–10.3)
Chloride: 103 mmol/L (ref 98–111)
Creatinine, Ser: 0.99 mg/dL (ref 0.61–1.24)
GFR, Estimated: >60 mL/min (ref >60)
Glucose, Bld: 72 mg/dL (ref 70–99)
Potassium: 3.8 mmol/L (ref 3.5–5.1)
Sodium: 140 mmol/L (ref 135–145)

## 2021-09-10 LAB — CBC WITH DIFFERENTIAL/PLATELET
Abs Immature Granulocytes: 0.03 10*3/uL (ref 0.00–0.07)
Basophils Absolute: 0 10*3/uL (ref 0.0–0.1)
Basophils Relative: 0 %
Eosinophils Absolute: 0.1 10*3/uL (ref 0.0–0.5)
Eosinophils Relative: 1 %
HCT: 36.6 % — ABNORMAL LOW (ref 39.0–52.0)
Hemoglobin: 12.7 g/dL — ABNORMAL LOW (ref 13.0–17.0)
Immature Granulocytes: 0 %
Lymphocytes Relative: 24 %
Lymphs Abs: 2 10*3/uL (ref 0.7–4.0)
MCH: 30.4 pg (ref 26.0–34.0)
MCHC: 34.7 g/dL (ref 30.0–36.0)
MCV: 87.6 fL (ref 80.0–100.0)
Monocytes Absolute: 0.7 10*3/uL (ref 0.1–1.0)
Monocytes Relative: 9 %
Neutro Abs: 5.5 10*3/uL (ref 1.7–7.7)
Neutrophils Relative %: 66 %
Platelets: 290 10*3/uL (ref 150–400)
RBC: 4.18 MIL/uL — ABNORMAL LOW (ref 4.22–5.81)
RDW: 12.8 % (ref 11.5–15.5)
WBC: 8.4 10*3/uL (ref 4.0–10.5)
nRBC: 0 % (ref 0.0–0.2)

## 2021-09-10 LAB — LACTIC ACID, PLASMA: Lactic Acid, Venous: 0.8 mmol/L (ref 0.5–1.9)

## 2021-09-10 NOTE — ED Triage Notes (Signed)
Pt with left leg swelling.  Diagnosed with cellulitis at [facility] yesterday.  Started on anbx.  Pt states swelling/pain has gotten progressively worse today.

## 2021-09-10 NOTE — ED Provider Triage Note (Signed)
Emergency Medicine Provider Triage Evaluation Note  Kevin Byrd , a 31 y.o. male  was evaluated in triage.  Pt complains of worsening swelling to left leg.  Patient reports that he has been taking Keflex medication as prescribed from [facility] on 09/09/2021 however swelling has gotten progressively worse.  Review of Systems  Positive: Swelling, erythema, warmth Negative: Fever, chills, nausea, vomiting, numbness, weakness  Physical Exam  BP 119/87 (BP Location: Right Arm)   Pulse 96   Temp 98.7 F (37.1 C) (Oral)   Resp 18   SpO2 98%  Gen:   Awake, no distress   Resp:  Normal effort  MSK:   Moves extremities without difficulty; swelling and erythema to medial aspect of left upper leg just proximal to knee Other:    Medical Decision Making  Medically screening exam initiated at 7:30 PM.  Appropriate orders placed.  Kevin Byrd was informed that the remainder of the evaluation will be completed by another provider, this initial triage assessment does not replace that evaluation, and the importance of remaining in the ED until their evaluation is complete.     Kevin Byrd, [place] 09/10/21 1931

## 2021-09-11 NOTE — ED Notes (Signed)
Patient called x2 for vitals recheck with no response and no longer visible in the lobby or outside any more

## 2021-09-12 ENCOUNTER — Encounter (HOSPITAL_COMMUNITY): Payer: Self-pay

## 2021-09-12 ENCOUNTER — Emergency Department (HOSPITAL_COMMUNITY)
Admission: EM | Admit: 2021-09-12 | Discharge: 2021-09-12 | Disposition: A | Discharge status: Home / Self Care | Payer: Self-pay | Attending: Emergency Medicine | Admitting: Emergency Medicine

## 2021-09-12 ENCOUNTER — Other Ambulatory Visit: Payer: Self-pay

## 2021-09-12 DIAGNOSIS — M79605 Pain in left leg: Secondary | ICD-10-CM | POA: Insufficient documentation

## 2021-09-12 DIAGNOSIS — S7012XA Contusion of left thigh, initial encounter: Secondary | ICD-10-CM

## 2021-09-12 DIAGNOSIS — R600 Localized edema: Secondary | ICD-10-CM

## 2021-09-12 MED ORDER — OXYCODONE-ACETAMINOPHEN 5-325 MG PO TABS: 1.0000 | ORAL_TABLET | Freq: Four times a day (QID) | ORAL | 0 refills | Status: AC | PRN

## 2021-09-12 MED ORDER — TRAMADOL HCL 50 MG PO TABS: 50.0000 mg | ORAL_TABLET | Freq: Four times a day (QID) | ORAL | 0 refills | Status: AC | PRN

## 2021-09-12 MED ORDER — OXYCODONE-ACETAMINOPHEN 5-325 MG PO TABS
1.0000 | ORAL_TABLET | Freq: Once | ORAL | Status: AC
  Administered 2021-09-12: 1 via ORAL
  Filled 2021-09-12: qty 1

## 2021-09-12 MED ORDER — CELECOXIB 200 MG PO CAPS: 200.0000 mg | ORAL_CAPSULE | Freq: Two times a day (BID) | ORAL | 0 refills | Status: AC

## 2021-09-12 NOTE — ED Triage Notes (Signed)
Pt states he has left leg swelling- pt was seen Wed and Thurs for cellulitis. Pt states that he has been taking abx as prescribed. Pt is concerned if abx are helping.

## 2021-09-12 NOTE — Discharge Instructions (Addendum)
You will need to return tomorrow per the instructions to have an ultrasound of your left to rule out a DVT.  You will also need to follow-up with orthopedics for further evaluation of your leg to make sure you do not have a tendon rupture.  Return to the emergency department if you are having any new or worsening symptoms or if you develop sudden onset of chest pain shortness of breath.  Use ice not heat on this area.

## 2021-09-12 NOTE — ED Provider Notes (Signed)
[facility] EMERGENCY DEPARTMENT Provider Note   CSN: [id number] Arrival date & time: 09/12/21  1829     History  Chief Complaint  Patient presents with   Leg Pain    Kevin Byrd is a 31 y.o. male presents with chief complaint of left leg pain and swelling.  Patient was seen for the same complaint on 09/09/2021.  At that time he was felt to have cellulitis.  Informal bedside ultrasound and aspiration showed no evidence of fluid collection.  Patient was discharged on Keflex.  He returned on 7?21 but left before being seen.  Patient reports that his symptoms began 9 days ago when he was at the beach.  He was walking along the beach when he had sudden onset of severe sharp pain in the posterior medial thigh.  He states that he immediately was having significant pain in the leg and had to limp.  By the time he got back to his beach towel he noticed that there was a large red area.  He had progressive swelling and was noted to be indurated and erythematous and he was seen 6 days later in the emergency department.  Patient states that he has had progressive swelling of his leg.  He still having severe pain despite 600 mg ibuprofen difficulty ambulating especially when he tries to fully extend his knee or actively flex the knee.  He denies any chest pain or shortness of breath   Leg Pain      Home Medications Prior to Admission medications   Medication Sig Start Date End Date Taking? Authorizing Provider  cephALEXin (KEFLEX) 500 MG capsule Take 1 capsule (500 mg total) by mouth 4 (four) times daily. 09/09/21   Burgess Amor, PA-C  diclofenac (VOLTAREN) 75 MG EC tablet Take 1 tablet (75 mg total) by mouth 2 (two) times daily. 06/04/20   [name], PA-C  ibuprofen (ADVIL) 600 MG tablet Take 1 tablet (600 mg total) by mouth 3 (three) times daily. 09/09/21   Burgess Amor, PA-C  methocarbamol (ROBAXIN) 500 MG tablet Take 1 tablet (500 mg total) by mouth 4 (four) times daily. 06/04/20   [name], PA-C  naproxen (NAPROSYN) 500 MG tablet Take 1 tablet (500 mg total) by mouth 2 (two) times daily with a meal. 02/15/13   Eber Hong, MD  ondansetron (ZOFRAN) 4 MG tablet Take 1 tablet (4 mg total) by mouth every 8 (eight) hours as needed for nausea or vomiting. 12/16/15   [name], MD  oxyCODONE-acetaminophen (PERCOCET/ROXICET) 5-325 MG tablet Take 1-2 tablets by mouth every 6 (six) hours as needed for moderate pain or severe pain. 12/16/15   [name], MD      Allergies    Bee venom    Review of Systems   Review of Systems  Physical Exam Updated Vital Signs BP (!) 157/91   Pulse 67   Temp 98.5 F (36.9 C) (Oral)   Resp 16   Ht 6\' 1"  (1.854 m)   Wt 90.7 kg   SpO2 100%   BMI 26.39 kg/m  Physical Exam Vitals and nursing note reviewed.  Constitutional:      General: He is not in acute distress.    Appearance: He is well-developed. He is not diaphoretic.  HENT:     Head: Normocephalic and atraumatic.  Eyes:     General: No scleral icterus.    Conjunctiva/sclera: Conjunctivae normal.  Cardiovascular:     Rate and Rhythm: Normal rate  and regular rhythm.     Heart sounds: Normal heart sounds.  Pulmonary:     Effort: Pulmonary effort is normal. No respiratory distress.     Breath sounds: Normal breath sounds.  Abdominal:     Palpations: Abdomen is soft.     Tenderness: There is no abdominal tenderness.  Musculoskeletal:     Cervical back: Normal range of motion and neck supple.     Comments: Left medial thigh with extensive indurated bruising along the medial portion extending to the posterior side exquisitely tender to palpation, erythema and and swelling goes to about midway up the middle part of the thigh but does not extend to the groin there is also swelling in the calf and lower leg.  Compartments are soft without tenderness.  Patient does not have significant pain with passive range of motion of the knee however has severe pain when he actively  flexes the knee.  Skin:    General: Skin is warm and dry.  Neurological:     Mental Status: He is alert.  Psychiatric:        Behavior: Behavior normal.     ED Results / Procedures / Treatments   Labs (all labs ordered are listed, but only abnormal results are displayed) Labs Reviewed - No data to display  EKG None  Radiology No results found.  Procedures Procedures    Medications Ordered in ED Medications - No data to display  ED Course/ Medical Decision Making/ A&P                           Medical Decision Making  Patient here for reevaluation of leg.  Given the history and clinical appearance I do not think that the patient has cellulitis although infected hematoma could potentially be part of what he is got going on and this may have improved with his oral antibiotics.  However I have far more concern for potential spontaneous tendon rupture of one of his hamstrings or significant partial tear or severe strain injury of the muscle resulting in hematoma formation.  I do think this is more likely musculoskeletal in nature and less likely cellulitic. Secondly the patient has global swelling of the entire leg with recent spontaneous injury and due to that I feel strongly the patient needs DVT ultrasound tomorrow.  In the meantime will increase the patient's pain control.  He was given a knee immobilizer and crutches.  I will give the patient a referral to orthopedics.  He may follow-up tomorrow for DVT ultrasound.  Patient states that he was instructed to use heat however I have advised ice.   Final Clinical Impression(s) / ED Diagnoses Final diagnoses:  None    Rx / DC Orders ED Discharge Orders     None         [name], PA-C 09/12/21 2010    Eber Hong, MD 09/13/21 1505

## 2021-09-13 ENCOUNTER — Ambulatory Visit (HOSPITAL_COMMUNITY)
Admission: RE | Admit: 2021-09-13 | Discharge: 2021-09-13 | Disposition: A | Discharge status: Home / Self Care | Payer: Self-pay | Source: Ambulatory Visit | Attending: Emergency Medicine | Admitting: Emergency Medicine

## 2021-09-13 DIAGNOSIS — R600 Localized edema: Secondary | ICD-10-CM | POA: Insufficient documentation

## 2021-09-13 MED FILL — Oxycodone w/ Acetaminophen Tab 5-325 MG: ORAL | Qty: 6 | Status: AC

## 2021-09-13 NOTE — ED Provider Notes (Signed)
He returns as planned today for DVT study of the left leg.  No DVT present, findings consistent with seroma versus hematoma behind the knee.  Patient states he feels better ambulating without the knee immobilizer and is using crutches occasionally.  He plans on calling orthopedics later today for follow-up appointment.  No indication for further intervention from the ED setting.  He is advised to use heat and elevate the left leg is much as possible.   Mancel Bale, MD 09/13/21 1230

## 2022-04-05 LAB — BASIC METABOLIC PANEL
BUN: 17 (ref 4–21)
CO2: 32 — AB (ref 13–22)
Chloride: 106 (ref 99–108)
Creatinine: 0.9 (ref 0.6–1.3)
Glucose: 155
Potassium: 3.9 meq/L (ref 3.5–5.1)
Sodium: 141 (ref 137–147)

## 2022-04-05 LAB — PROTEIN / CREATININE RATIO, URINE
Albumin, U: 7.2
Creatinine, Urine: 114

## 2022-04-05 LAB — CBC AND DIFFERENTIAL
HCT: 41 (ref 41–53)
Hemoglobin: 13.6 (ref 13.5–17.5)
Platelets: 194 10*3/uL (ref 150–400)
WBC: 10

## 2022-04-05 LAB — VITAMIN D 25 HYDROXY (VIT D DEFICIENCY, FRACTURES): Vit D, 25-Hydroxy: 52.9

## 2022-04-05 LAB — COMPREHENSIVE METABOLIC PANEL
Albumin: 3.8 (ref 3.5–5.0)
Calcium: 9.6 (ref 8.7–10.7)
eGFR: 87

## 2022-04-05 LAB — IRON,TIBC AND FERRITIN PANEL
%SAT: 37.6
Ferritin: 224.3
Iron: 94
TIBC: 250

## 2022-04-05 LAB — HEPATIC FUNCTION PANEL
ALT: 31 U/L (ref 10–40)
AST: 26 (ref 14–40)
Alkaline Phosphatase: 108 (ref 25–125)
Bilirubin, Total: 0.8

## 2022-04-05 LAB — VITAMIN B12: Vitamin B-12: 1576

## 2022-04-05 LAB — MICROALBUMIN, URINE: Microalb, Ur: 0.821

## 2022-04-05 LAB — HEMOGLOBIN A1C: Hemoglobin A1C: 6.5

## 2023-01-11 ENCOUNTER — Other Ambulatory Visit: Payer: Self-pay

## 2023-01-11 ENCOUNTER — Emergency Department (HOSPITAL_COMMUNITY)
Admission: EM | Admit: 2023-01-11 | Discharge: 2023-01-11 | Disposition: A | Discharge status: Home / Self Care | Payer: Self-pay | Attending: Emergency Medicine | Admitting: Emergency Medicine

## 2023-01-11 ENCOUNTER — Encounter (HOSPITAL_COMMUNITY): Payer: Self-pay

## 2023-01-11 DIAGNOSIS — L03811 Cellulitis of head [any part, except face]: Secondary | ICD-10-CM | POA: Insufficient documentation

## 2023-01-11 MED ORDER — SULFAMETHOXAZOLE-TRIMETHOPRIM 800-160 MG PO TABS: 1.0000 | ORAL_TABLET | Freq: Two times a day (BID) | ORAL | 0 refills | Status: AC

## 2023-01-11 NOTE — ED Triage Notes (Signed)
Pt has abscess on right ear lope since Friday. Area is warm and hard no drainage .

## 2023-01-11 NOTE — Discharge Instructions (Signed)
You were seen for skin infection (cellulitis) in the emergency department.   At home, please take the antibiotics (bactrim) we have prescribed you.  You may also take tylenol and ibuprofen for any pain that you have.   Check your MyChart online for the results of any tests that had not resulted by the time you left the emergency department.   Follow-up with your primary doctor in 2-3 days regarding your visit.    Return immediately to the emergency department if you experience any of the following: fevers, severe pain, rapid spread of the rash/redness, or any other concerning symptoms.    Thank you for visiting our Emergency Department. It was a pleasure taking care of you today.

## 2023-01-14 NOTE — ED Provider Notes (Signed)
[name] Provider Note   CSN: [id number] Arrival date & time: 01/11/23  7829     History  Chief Complaint  Patient presents with   Abscess    Kevin Byrd is a 32 y.o. male.  32 year old male previously healthy presents to the emergency department with bump on his right ear.  Patient reports that he thinks he was bitten by a spider on his right ear.  Has had redness and swelling and pain in that location.  Did not actually see an insect bite him but says that because of the way his ear looked he felt that way.  No fevers.  No hearing loss or tinnitus.       Home Medications Prior to Admission medications   Medication Sig Start Date End Date Taking? Authorizing Provider  sulfamethoxazole-trimethoprim (BACTRIM DS) 800-160 MG tablet Take 1 tablet by mouth 2 (two) times daily for 7 days. 01/11/23 01/18/23 Yes [name], MD  celecoxib (CELEBREX) 200 MG capsule Take 1 capsule (200 mg total) by mouth 2 (two) times daily. 09/12/21   [name], PA-C  methocarbamol (ROBAXIN) 500 MG tablet Take 1 tablet (500 mg total) by mouth 4 (four) times daily. 06/04/20   [name], PA-C  ondansetron (ZOFRAN) 4 MG tablet Take 1 tablet (4 mg total) by mouth every 8 (eight) hours as needed for nausea or vomiting. 12/16/15   [name], MD  oxyCODONE-acetaminophen (PERCOCET/ROXICET) 5-325 MG tablet Take 1-2 tablets by mouth every 6 (six) hours as needed for moderate pain or severe pain. 12/16/15   [name], MD  oxyCODONE-acetaminophen (PERCOCET/ROXICET) 5-325 MG tablet Take 1 tablet by mouth every 6 (six) hours as needed for severe pain. 09/12/21   [name], PA-C  traMADol (ULTRAM) 50 MG tablet Take 1 tablet (50 mg total) by mouth every 6 (six) hours as needed. 09/12/21   [name], PA-C      Allergies    Bee venom    Review of Systems   Review of Systems  Physical Exam Updated Vital Signs BP (!) 129/97   Pulse  (!) 53   Temp 97.8 F (36.6 C) (Oral)   Resp 17   Ht 6' (1.829 m)   Wt 95.3 kg   SpO2 98%   BMI 28.48 kg/m  Physical Exam Vitals and nursing note reviewed.  Constitutional:      General: He is not in acute distress.    Appearance: He is well-developed.  HENT:     Head: Normocephalic and atraumatic.     Right Ear: Tympanic membrane and ear canal normal.     Left Ear: Tympanic membrane, ear canal and external ear normal.     Ears:     Comments: Right TM with indurated 1 cm diameter area.  Overlying erythema present.    Nose: Nose normal.  Eyes:     Extraocular Movements: Extraocular movements intact.     Conjunctiva/sclera: Conjunctivae normal.     Pupils: Pupils are equal, round, and reactive to light.  Cardiovascular:     Rate and Rhythm: Normal rate and regular rhythm.  Pulmonary:     Effort: Pulmonary effort is normal. No respiratory distress.  Musculoskeletal:     Cervical back: Normal range of motion and neck supple.  Lymphadenopathy:     Cervical: No cervical adenopathy.  Skin:    General: Skin is warm and dry.  Neurological:     Mental Status: He is  alert. Mental status is at baseline.  Psychiatric:        Mood and Affect: Mood normal.        Behavior: Behavior normal.     ED Results / Procedures / Treatments   Labs (all labs ordered are listed, but only abnormal results are displayed) Labs Reviewed - No data to display  EKG None  Radiology No results found.  Procedures Fine needle aspiration  Date/Time: 01/14/2023 4:49 PM  Performed by: [name], MD Authorized by: [name], MD  Consent: Verbal consent obtained. Risks and benefits: risks, benefits and alternatives were discussed Consent given by: patient Patient identity confirmed: verbally with patient Time out: Immediately prior to procedure a "time out" was called to verify the correct patient, procedure, equipment, support staff and site/side marked as required. Local  anesthesia used: no  Anesthesia: Local anesthesia used: no  Sedation: Patient sedated: no  Patient tolerance: patient tolerated the procedure well with no immediate complications Comments: Attempted aspiration of indurated area thought to possibly be abscess.  No significant purulent material was able to be expressed.  Only scant amount of blood.  Procedure terminated without complication.       Medications Ordered in ED Medications - No data to display  ED Course/ Medical Decision Making/ A&P                                 Medical Decision Making Risk Prescription drug management.   Kevin Byrd is a 32 y.o. male previously healthy presents emergency department with right ear pain and swelling  Initial Ddx:  Cellulitis, abscess, swollen lymph node  MDM/Course:  Patient presents to the emergency department with swelling and pain of his ear.  Does have overlying erythema.  Does have a very indurated area that is 1 cm.  Due to the size very difficult to tell if it is fluctuant and an abscess or not.  Did attempt needle aspiration that did not aspirate any purulent material but only a scant amount of blood.  Suspect that it may be cellulitis with a phlegmon instead of an abscess.  Will start him on antibiotics at this time.   This patient presents to the ED for concern of complaints listed in HPI, this involves an extensive number of treatment options, and is a complaint that carries with it a high risk of complications and morbidity. Disposition including potential need for admission considered.   Dispo: DC Home. Return precautions discussed including, but not limited to, those listed in the AVS. Allowed pt time to ask questions which were answered fully prior to dc.  Records reviewed Outpatient Clinic Notes I have reviewed the patients home medications and made adjustments as needed  Portions of this note were generated with Dragon dictation software. Dictation errors  may occur despite best attempts at proofreading.           Final Clinical Impression(s) / ED Diagnoses Final diagnoses:  Cellulitis of head except face    Rx / DC Orders ED Discharge Orders          Ordered    sulfamethoxazole-trimethoprim (BACTRIM DS) 800-160 MG tablet  2 times daily        01/11/23 1026              [name], MD 01/14/23 1650

## 2023-05-04 ENCOUNTER — Encounter (HOSPITAL_COMMUNITY): Payer: Self-pay | Admitting: Emergency Medicine

## 2023-05-04 ENCOUNTER — Other Ambulatory Visit: Payer: Self-pay

## 2023-05-04 ENCOUNTER — Emergency Department (HOSPITAL_COMMUNITY): Admission: EM | Admit: 2023-05-04 | Discharge: 2023-05-05 | Disposition: A | Discharge status: Home / Self Care

## 2023-05-04 DIAGNOSIS — M542 Cervicalgia: Secondary | ICD-10-CM | POA: Insufficient documentation

## 2023-05-04 DIAGNOSIS — Y92410 Unspecified street and highway as the place of occurrence of the external cause: Secondary | ICD-10-CM | POA: Insufficient documentation

## 2023-05-04 DIAGNOSIS — M25512 Pain in left shoulder: Secondary | ICD-10-CM | POA: Insufficient documentation

## 2023-05-04 MED ORDER — METHOCARBAMOL 500 MG PO TABS: 500.0000 mg | ORAL_TABLET | Freq: Two times a day (BID) | ORAL | 0 refills | Status: AC
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
